# Patient Record
Sex: Female | Born: 1975 | Hispanic: No | Marital: Married | Smoking: Never smoker
Health system: Southern US, Community
[De-identification: ages and names within clinical notes are randomized; demographics above are authoritative.]

## PROBLEM LIST (undated history)

## (undated) ENCOUNTER — Inpatient Hospital Stay (HOSPITAL_COMMUNITY): Payer: Self-pay

## (undated) DIAGNOSIS — I10 Essential (primary) hypertension: Secondary | ICD-10-CM

## (undated) HISTORY — PX: MYOMECTOMY ABDOMINAL APPROACH: SUR870

---

## 2015-09-11 ENCOUNTER — Inpatient Hospital Stay (HOSPITAL_COMMUNITY)
Admission: AD | Admit: 2015-09-11 | Discharge: 2015-09-11 | Disposition: A | Discharge status: Home / Self Care | Payer: Self-pay | Source: Ambulatory Visit | Attending: Family Medicine | Admitting: Family Medicine

## 2015-09-11 ENCOUNTER — Inpatient Hospital Stay (HOSPITAL_COMMUNITY): Payer: Self-pay

## 2015-09-11 ENCOUNTER — Encounter (HOSPITAL_COMMUNITY): Payer: Self-pay | Admitting: *Deleted

## 2015-09-11 DIAGNOSIS — O09522 Supervision of elderly multigravida, second trimester: Secondary | ICD-10-CM

## 2015-09-11 DIAGNOSIS — O209 Hemorrhage in early pregnancy, unspecified: Secondary | ICD-10-CM | POA: Insufficient documentation

## 2015-09-11 DIAGNOSIS — O34219 Maternal care for unspecified type scar from previous cesarean delivery: Secondary | ICD-10-CM

## 2015-09-11 DIAGNOSIS — O3412 Maternal care for benign tumor of corpus uteri, second trimester: Secondary | ICD-10-CM | POA: Insufficient documentation

## 2015-09-11 DIAGNOSIS — D259 Leiomyoma of uterus, unspecified: Secondary | ICD-10-CM | POA: Diagnosis present

## 2015-09-11 DIAGNOSIS — O4691 Antepartum hemorrhage, unspecified, first trimester: Secondary | ICD-10-CM

## 2015-09-11 DIAGNOSIS — Z3A14 14 weeks gestation of pregnancy: Secondary | ICD-10-CM | POA: Insufficient documentation

## 2015-09-11 DIAGNOSIS — R58 Hemorrhage, not elsewhere classified: Secondary | ICD-10-CM

## 2015-09-11 DIAGNOSIS — O4692 Antepartum hemorrhage, unspecified, second trimester: Secondary | ICD-10-CM

## 2015-09-11 DIAGNOSIS — O10912 Unspecified pre-existing hypertension complicating pregnancy, second trimester: Secondary | ICD-10-CM | POA: Insufficient documentation

## 2015-09-11 DIAGNOSIS — Z98891 History of uterine scar from previous surgery: Secondary | ICD-10-CM

## 2015-09-11 DIAGNOSIS — O09529 Supervision of elderly multigravida, unspecified trimester: Secondary | ICD-10-CM

## 2015-09-11 HISTORY — DX: Essential (primary) hypertension: I10

## 2015-09-11 LAB — URINALYSIS, ROUTINE W REFLEX MICROSCOPIC
Bilirubin Urine: NEGATIVE
GLUCOSE, UA: NEGATIVE mg/dL
KETONES UR: NEGATIVE mg/dL
LEUKOCYTES UA: NEGATIVE
Nitrite: NEGATIVE
PH: 6.5 (ref 5.0–8.0)
Protein, ur: NEGATIVE mg/dL
Specific Gravity, Urine: 1.015 (ref 1.005–1.030)
Urobilinogen, UA: 0.2 mg/dL (ref 0.0–1.0)

## 2015-09-11 LAB — URINE MICROSCOPIC-ADD ON

## 2015-09-11 LAB — ABO/RH: ABO/RH(D): O POS

## 2015-09-11 MED ORDER — SODIUM CHLORIDE 0.9 % IV SOLN
INTRAVENOUS | Status: DC
  Administered 2015-09-11: 12:00:00 via INTRAVENOUS

## 2015-09-11 MED ORDER — ACETAMINOPHEN 325 MG PO TABS
650.0000 mg | ORAL_TABLET | Freq: Once | ORAL | Status: AC
  Administered 2015-09-11: 650 mg via ORAL
  Filled 2015-09-11: qty 2

## 2015-09-11 MED ORDER — LABETALOL HCL 200 MG PO TABS: 200.0000 mg | ORAL_TABLET | Freq: Two times a day (BID) | ORAL | Status: DC

## 2015-09-11 MED ORDER — LABETALOL HCL 5 MG/ML IV SOLN
20.0000 mg | Freq: Once | INTRAVENOUS | Status: AC
  Administered 2015-09-11: 20 mg via INTRAVENOUS
  Filled 2015-09-11: qty 4

## 2015-09-11 NOTE — MAU Note (Signed)
Pt C/O bleeding since 0200, denies pain.  Has changed one pad since bleeding started.

## 2015-09-11 NOTE — MAU Provider Note (Signed)
  History     CSN: [id number]  Arrival date and time: 09/11/15 1009   First Provider Initiated Contact with Patient 09/11/15 1101      Chief Complaint  Patient presents with  . Vaginal Bleeding   HPI  Lindsey Dean 39 y.o. G2P1001 [redacted]w[redacted]d presents to the MAU after having a light bleeding episode that started at 200am. She also has a history of chronic hypertension and is not currently on her amolodipine since she found out she was pregnant.  Past Medical History  Diagnosis Date  . Hypertension     Past Surgical History  Procedure Laterality Date  . Cesarean section    . Myomectomy abdominal approach      No family history on file.  Social History  Substance Use Topics  . Smoking status: Never Smoker   . Smokeless tobacco: None  . Alcohol Use: No    Allergies: No Known Allergies  No prescriptions prior to admission    Review of Systems  Constitutional: Negative for fever.  Genitourinary:       Vaginal bleeding   All other systems reviewed and are negative.  Physical Exam   Blood pressure 189/108, pulse 88, temperature 98.8 F (37.1 C), temperature source Oral, resp. rate 16.  Physical Exam  Nursing note and vitals reviewed. Constitutional: She is oriented to person, place, and time. She appears well-developed and well-nourished. No distress.  HENT:  Head: Normocephalic and atraumatic.  Neck: Normal range of motion.  Cardiovascular: Normal rate.   Blood pressure elevated  Respiratory: Effort normal. No respiratory distress.  Genitourinary:  Spec exam reveals old dark blood in vaginal vault; positive fht's with doppler  Musculoskeletal: Normal range of motion.  Neurological: She is alert and oriented to person, place, and time.  Skin: Skin is warm and dry.  Psychiatric: She has a normal mood and affect. Her behavior is normal. Judgment and thought content normal.    MAU Course  Procedures  MDM IV; antihypertensive; ultrasound Results for orders  placed or performed during the hospital encounter of 09/11/15 (from the past 24 hour(s))  Urinalysis, Routine w reflex microscopic (not at [facility])     Status: Abnormal   Collection Time: 09/11/15 10:25 AM  Result Value Ref Range   Color, Urine YELLOW YELLOW   APPearance CLEAR CLEAR   Specific Gravity, Urine 1.015 1.005 - 1.030   pH 6.5 5.0 - 8.0   Glucose, UA NEGATIVE NEGATIVE mg/dL   Hgb urine dipstick LARGE (A) NEGATIVE   Bilirubin Urine NEGATIVE NEGATIVE   Ketones, ur NEGATIVE NEGATIVE mg/dL   Protein, ur NEGATIVE NEGATIVE mg/dL   Urobilinogen, UA 0.2 0.0 - 1.0 mg/dL   Nitrite NEGATIVE NEGATIVE   Leukocytes, UA NEGATIVE NEGATIVE  Urine microscopic-add on     Status: Abnormal   Collection Time: 09/11/15 10:25 AM  Result Value Ref Range   Squamous Epithelial / LPF MANY (A) RARE   WBC, UA 0-2 <3 WBC/hpf   RBC / HPF 7-10 <3 RBC/hpf   Bacteria, UA FEW (A) RARE   Urine-Other MUCOUS PRESENT   ABO/Rh     Status: None   Collection Time: 09/11/15 12:20 PM  Result Value Ref Range   ABO/RH(D) O POS    Assessment and Plan  Vaginal bleeding in pregnancy Uterine fibroids Chronic Hypertension Discharge to home with labetalol  Lindsey Dean 09/11/2015, 11:13 AM

## 2015-09-11 NOTE — Discharge Instructions (Signed)
Uterine Fibroids Uterine fibroids are tissue masses (tumors) that can develop in the womb (uterus). They are also called leiomyomas. This type of tumor is not cancerous (benign) and does not spread to other parts of the body outside of the pelvic area, which is between the hip bones. Occasionally, fibroids may develop in the fallopian tubes, in the cervix, or on the support structures (ligaments) that surround the uterus. You can have one or many fibroids. Fibroids can vary in size, weight, and where they grow in the uterus. Some can become quite large. Most fibroids do not require medical treatment. CAUSES A fibroid can develop when a single uterine cell keeps growing (replicating). Most cells in the human body have a control mechanism that keeps them from replicating without control. SIGNS AND SYMPTOMS Symptoms may include:   Heavy bleeding during your period.  Bleeding or spotting between periods.  Pelvic pain and pressure.  Bladder problems, such as needing to urinate more often (urinary frequency) or urgently.  Inability to reproduce offspring (infertility).  Miscarriages. DIAGNOSIS Uterine fibroids are diagnosed through a physical exam. Your health care provider may feel the lumpy tumors during a pelvic exam. Ultrasonography and an MRI may be done to determine the size, location, and number of fibroids. TREATMENT Treatment may include:  Watchful waiting. This involves getting the fibroid checked by your health care provider to see if it grows or shrinks. Follow your health care provider's recommendations for how often to have this checked.  Hormone medicines. These can be taken by mouth or given through an intrauterine device (IUD).  Surgery.  Removing the fibroids (myomectomy) or the uterus (hysterectomy).  Removing blood supply to the fibroids (uterine artery embolization). If fibroids interfere with your fertility and you want to become pregnant, your health care provider  may recommend having the fibroids removed.  HOME CARE INSTRUCTIONS  Keep all follow-up visits as directed by your health care provider. This is important.  Take medicines only as directed by your health care provider.  If you were prescribed a hormone treatment, take the hormone medicines exactly as directed.  Do not take aspirin, because it can cause bleeding.  Ask your health care provider about taking iron pills and increasing the amount of dark green, leafy vegetables in your diet. These actions can help to boost your blood iron levels, which may be affected by heavy menstrual bleeding.  Pay close attention to your period and tell your health care provider about any changes, such as:  Increased blood flow that requires you to use more pads or tampons than usual per month.  A change in the number of days that your period lasts per month.  A change in symptoms that are associated with your period, such as abdominal cramping or back pain. SEEK MEDICAL CARE IF:  You have pelvic pain, back pain, or abdominal cramps that cannot be controlled with medicines.  You have an increase in bleeding between and during periods.  You soak tampons or pads in a half hour or less.  You feel lightheaded, extra tired, or weak. SEEK IMMEDIATE MEDICAL CARE IF:  You faint.  You have a sudden increase in pelvic pain.   This information is not intended to replace advice given to you by your health care provider. Make sure you discuss any questions you have with your health care provider.   Document Released: 10/25/2000 Document Revised: 11/18/2014 Document Reviewed: 04/26/2014 Elsevier Interactive Patient Education 2016 [employer].  Vaginal Bleeding During Pregnancy, Second Trimester  A small amount of bleeding (spotting) from the vagina is relatively common in pregnancy. It usually stops on its own. Various things can cause bleeding or spotting in pregnancy. Some bleeding may be related to the  pregnancy, and some may not. Sometimes the bleeding is normal and is not a problem. However, bleeding can also be a sign of something serious. Be sure to tell your health care provider about any vaginal bleeding right away. Some possible causes of vaginal bleeding during the second trimester include:  Infection, inflammation, or growths on the cervix.   The placenta may be partially or completely covering the opening of the cervix inside the uterus (placenta previa).  The placenta may have separated from the uterus (abruption of the placenta).   You may be having early (preterm) labor.   The cervix may not be strong enough to keep a baby inside the uterus (cervical insufficiency).   Tiny cysts may have developed in the uterus instead of pregnancy tissue (molar pregnancy). HOME CARE INSTRUCTIONS  Watch your condition for any changes. The following actions may help to lessen any discomfort you are feeling:  Follow your health care provider's instructions for limiting your activity. If your health care provider orders bed rest, you may need to stay in bed and only get up to use the bathroom. However, your health care provider may allow you to continue light activity.  If needed, make plans for someone to help with your regular activities and responsibilities while you are on bed rest.  Keep track of the number of pads you use each day, how often you change pads, and how soaked (saturated) they are. Write this down.  Do not use tampons. Do not douche.  Do not have sexual intercourse or orgasms until approved by your health care provider.  If you pass any tissue from your vagina, save the tissue so you can show it to your health care provider.  Only take over-the-counter or prescription medicines as directed by your health care provider.  Do not take aspirin because it can make you bleed.  Do not exercise or perform any strenuous activities or heavy lifting without your health care  provider's permission.  Keep all follow-up appointments as directed by your health care provider. SEEK MEDICAL CARE IF:  You have any vaginal bleeding during any part of your pregnancy.  You have cramps or labor pains.  You have a fever, not controlled by medicine. SEEK IMMEDIATE MEDICAL CARE IF:   You have severe cramps in your back or belly (abdomen).  You have contractions.  You have chills.  You pass large clots or tissue from your vagina.  Your bleeding increases.  You feel light-headed or weak, or you have fainting episodes.  You are leaking fluid or have a gush of fluid from your vagina. MAKE SURE YOU:  Understand these instructions.  Will watch your condition.  Will get help right away if you are not doing well or get worse.   This information is not intended to replace advice given to you by your health care provider. Make sure you discuss any questions you have with your health care provider.   Document Released: 08/07/2005 Document Revised: 11/02/2013 Document Reviewed: 07/05/2013 Elsevier Interactive Patient Education [employer].

## 2015-09-21 ENCOUNTER — Other Ambulatory Visit (HOSPITAL_COMMUNITY): Payer: Self-pay | Admitting: Urology

## 2015-09-21 DIAGNOSIS — Z3689 Encounter for other specified antenatal screening: Secondary | ICD-10-CM

## 2015-09-21 DIAGNOSIS — Z3A19 19 weeks gestation of pregnancy: Secondary | ICD-10-CM

## 2015-09-21 DIAGNOSIS — O09522 Supervision of elderly multigravida, second trimester: Secondary | ICD-10-CM

## 2015-09-21 LAB — OB RESULTS CONSOLE ANTIBODY SCREEN: Antibody Screen: NEGATIVE

## 2015-09-21 LAB — OB RESULTS CONSOLE HGB/HCT, BLOOD
HEMATOCRIT: 36 %
HEMOGLOBIN: 11.9 g/dL

## 2015-09-21 LAB — OB RESULTS CONSOLE GC/CHLAMYDIA
CHLAMYDIA, DNA PROBE: NEGATIVE
GC PROBE AMP, GENITAL: NEGATIVE

## 2015-09-21 LAB — OB RESULTS CONSOLE ABO/RH: RH Type: POSITIVE

## 2015-09-21 LAB — OB RESULTS CONSOLE VARICELLA ZOSTER ANTIBODY, IGG: Varicella: IMMUNE

## 2015-09-21 LAB — OB RESULTS CONSOLE HEPATITIS B SURFACE ANTIGEN: Hepatitis B Surface Ag: NEGATIVE

## 2015-09-21 LAB — OB RESULTS CONSOLE HIV ANTIBODY (ROUTINE TESTING): HIV: NONREACTIVE

## 2015-09-21 LAB — OB RESULTS CONSOLE RPR: RPR: NONREACTIVE

## 2015-09-21 LAB — OB RESULTS CONSOLE RUBELLA ANTIBODY, IGM: RUBELLA: IMMUNE

## 2015-09-21 LAB — OB RESULTS CONSOLE PLATELET COUNT: PLATELETS: 215 10*3/uL

## 2015-10-09 ENCOUNTER — Encounter: Payer: Self-pay | Admitting: *Deleted

## 2015-10-09 ENCOUNTER — Encounter: Payer: Self-pay | Admitting: Obstetrics and Gynecology

## 2015-10-09 ENCOUNTER — Ambulatory Visit (INDEPENDENT_AMBULATORY_CARE_PROVIDER_SITE_OTHER): Payer: Self-pay | Admitting: Obstetrics and Gynecology

## 2015-10-09 VITALS — BP 152/97 | HR 86 | Temp 98.3°F | Wt 193.5 lb

## 2015-10-09 DIAGNOSIS — O10012 Pre-existing essential hypertension complicating pregnancy, second trimester: Secondary | ICD-10-CM

## 2015-10-09 DIAGNOSIS — O0992 Supervision of high risk pregnancy, unspecified, second trimester: Secondary | ICD-10-CM

## 2015-10-09 DIAGNOSIS — Z8759 Personal history of other complications of pregnancy, childbirth and the puerperium: Secondary | ICD-10-CM

## 2015-10-09 DIAGNOSIS — O10019 Pre-existing essential hypertension complicating pregnancy, unspecified trimester: Secondary | ICD-10-CM | POA: Insufficient documentation

## 2015-10-09 DIAGNOSIS — D259 Leiomyoma of uterus, unspecified: Secondary | ICD-10-CM

## 2015-10-09 DIAGNOSIS — N90810 Female genital mutilation status, unspecified: Secondary | ICD-10-CM

## 2015-10-09 DIAGNOSIS — O0990 Supervision of high risk pregnancy, unspecified, unspecified trimester: Secondary | ICD-10-CM | POA: Insufficient documentation

## 2015-10-09 DIAGNOSIS — I10 Essential (primary) hypertension: Secondary | ICD-10-CM | POA: Insufficient documentation

## 2015-10-09 DIAGNOSIS — Z98891 History of uterine scar from previous surgery: Secondary | ICD-10-CM

## 2015-10-09 LAB — POCT URINALYSIS DIP (DEVICE)
BILIRUBIN URINE: NEGATIVE
Glucose, UA: NEGATIVE mg/dL
Hgb urine dipstick: NEGATIVE
KETONES UR: NEGATIVE mg/dL
LEUKOCYTES UA: NEGATIVE
NITRITE: NEGATIVE
Protein, ur: NEGATIVE mg/dL
Specific Gravity, Urine: 1.02 (ref 1.005–1.030)
Urobilinogen, UA: 0.2 mg/dL (ref 0.0–1.0)
pH: 7 (ref 5.0–8.0)

## 2015-10-09 MED ORDER — ASPIRIN EC 81 MG PO TBEC: 81.0000 mg | DELAYED_RELEASE_TABLET | Freq: Every day | ORAL | Status: DC

## 2015-10-09 NOTE — Patient Instructions (Signed)
Contraception Choices Contraception (birth control) is the use of any methods or devices to prevent pregnancy. Below are some methods to help avoid pregnancy. HORMONAL METHODS   Contraceptive implant. This is a thin, plastic tube containing progesterone hormone. It does not contain estrogen hormone. Your health care provider inserts the tube in the inner part of the upper arm. The tube can remain in place for up to 3 years. After 3 years, the implant must be removed. The implant prevents the ovaries from releasing an egg (ovulation), thickens the cervical mucus to prevent sperm from entering the uterus, and thins the lining of the inside of the uterus.  Progesterone-only injections. These injections are given every 3 months by your health care provider to prevent pregnancy. This synthetic progesterone hormone stops the ovaries from releasing eggs. It also thickens cervical mucus and changes the uterine lining. This makes it harder for sperm to survive in the uterus.  Birth control pills. These pills contain estrogen and progesterone hormone. They work by preventing the ovaries from releasing eggs (ovulation). They also cause the cervical mucus to thicken, preventing the sperm from entering the uterus. Birth control pills are prescribed by a health care provider.Birth control pills can also be used to treat heavy periods.  Minipill. This type of birth control pill contains only the progesterone hormone. They are taken every day of each month and must be prescribed by your health care provider.  Birth control patch. The patch contains hormones similar to those in birth control pills. It must be changed once a week and is prescribed by a health care provider.  Vaginal ring. The ring contains hormones similar to those in birth control pills. It is left in the vagina for 3 weeks, removed for 1 week, and then a new one is put back in place. The patient must be comfortable inserting and removing the ring  from the vagina.A health care provider's prescription is necessary.  Emergency contraception. Emergency contraceptives prevent pregnancy after unprotected sexual intercourse. This pill can be taken right after sex or up to 5 days after unprotected sex. It is most effective the sooner you take the pills after having sexual intercourse. Most emergency contraceptive pills are available without a prescription. Check with your pharmacist. Do not use emergency contraception as your only form of birth control. BARRIER METHODS   Female condom. This is a thin sheath (latex or rubber) that is worn over the penis during sexual intercourse. It can be used with spermicide to increase effectiveness.  Female condom. This is a soft, loose-fitting sheath that is put into the vagina before sexual intercourse.  Diaphragm. This is a soft, latex, dome-shaped barrier that must be fitted by a health care provider. It is inserted into the vagina, along with a spermicidal jelly. It is inserted before intercourse. The diaphragm should be left in the vagina for 6 to 8 hours after intercourse.  Cervical cap. This is a round, soft, latex or plastic cup that fits over the cervix and must be fitted by a health care provider. The cap can be left in place for up to 48 hours after intercourse.  Sponge. This is a soft, circular piece of polyurethane foam. The sponge has spermicide in it. It is inserted into the vagina after wetting it and before sexual intercourse.  Spermicides. These are chemicals that kill or block sperm from entering the cervix and uterus. They come in the form of creams, jellies, suppositories, foam, or tablets. They do not require a   prescription. They are inserted into the vagina with an applicator before having sexual intercourse. The process must be repeated every time you have sexual intercourse. INTRAUTERINE CONTRACEPTION  Intrauterine device (IUD). This is a T-shaped device that is put in a woman's uterus  during a menstrual period to prevent pregnancy. There are 2 types:  Copper IUD. This type of IUD is wrapped in copper wire and is placed inside the uterus. Copper makes the uterus and fallopian tubes produce a fluid that kills sperm. It can stay in place for 10 years.  Hormone IUD. This type of IUD contains the hormone progestin (synthetic progesterone). The hormone thickens the cervical mucus and prevents sperm from entering the uterus, and it also thins the uterine lining to prevent implantation of a fertilized egg. The hormone can weaken or kill the sperm that get into the uterus. It can stay in place for 3-5 years, depending on which type of IUD is used. PERMANENT METHODS OF CONTRACEPTION  Female tubal ligation. This is when the woman's fallopian tubes are surgically sealed, tied, or blocked to prevent the egg from traveling to the uterus.  Hysteroscopic sterilization. This involves placing a small coil or insert into each fallopian tube. Your doctor uses a technique called hysteroscopy to do the procedure. The device causes scar tissue to form. This results in permanent blockage of the fallopian tubes, so the sperm cannot fertilize the egg. It takes about 3 months after the procedure for the tubes to become blocked. You must use another form of birth control for these 3 months.  Female sterilization. This is when the female has the tubes that carry sperm tied off (vasectomy).This blocks sperm from entering the vagina during sexual intercourse. After the procedure, the man can still ejaculate fluid (semen). NATURAL PLANNING METHODS  Natural family planning. This is not having sexual intercourse or using a barrier method (condom, diaphragm, cervical cap) on days the woman could become pregnant.  Calendar method. This is keeping track of the length of each menstrual cycle and identifying when you are fertile.  Ovulation method. This is avoiding sexual intercourse during ovulation.  Symptothermal  method. This is avoiding sexual intercourse during ovulation, using a thermometer and ovulation symptoms.  Post-ovulation method. This is timing sexual intercourse after you have ovulated. Regardless of which type or method of contraception you choose, it is important that you use condoms to protect against the transmission of sexually transmitted infections (STIs). Talk with your health care provider about which form of contraception is most appropriate for you.   This information is not intended to replace advice given to you by your health care provider. Make sure you discuss any questions you have with your health care provider.   Document Released: 10/28/2005 Document Revised: 11/02/2013 Document Reviewed: 04/22/2013 Elsevier Interactive Patient Education 2016 [employer]  Breastfeeding Deciding to breastfeed is one of the best choices you can make for you and your baby. A change in hormones during pregnancy causes your breast tissue to grow and increases the number and size of your milk ducts. These hormones also allow proteins, sugars, and fats from your blood supply to make breast milk in your milk-producing glands. Hormones prevent breast milk from being released before your baby is born as well as prompt milk flow after birth. Once breastfeeding has begun, thoughts of your baby, as well as his or her sucking or crying, can stimulate the release of milk from your milk-producing glands.  BENEFITS OF BREASTFEEDING For Your Baby    Your first milk (colostrum) helps your baby's digestive system function better.  There are antibodies in your milk that help your baby fight off infections.  Your baby has a lower incidence of asthma, allergies, and sudden infant death syndrome.  The nutrients in breast milk are better for your baby than infant formulas and are designed uniquely for your baby's needs.  Breast milk improves your baby's brain development.  Your baby is less likely to develop  other conditions, such as childhood obesity, asthma, or type 2 diabetes mellitus. For You  Breastfeeding helps to create a very special bond between you and your baby.  Breastfeeding is convenient. Breast milk is always available at the correct temperature and costs nothing.  Breastfeeding helps to burn calories and helps you lose the weight gained during pregnancy.  Breastfeeding makes your uterus contract to its prepregnancy size faster and slows bleeding (lochia) after you give birth.   Breastfeeding helps to lower your risk of developing type 2 diabetes mellitus, osteoporosis, and breast or ovarian cancer later in life. SIGNS THAT YOUR BABY IS HUNGRY Early Signs of Hunger  Increased alertness or activity.  Stretching.  Movement of the head from side to side.  Movement of the head and opening of the mouth when the corner of the mouth or cheek is stroked (rooting).  Increased sucking sounds, smacking lips, cooing, sighing, or squeaking.  Hand-to-mouth movements.  Increased sucking of fingers or hands. Late Signs of Hunger  Fussing.  Intermittent crying. Extreme Signs of Hunger Signs of extreme hunger will require calming and consoling before your baby will be able to breastfeed successfully. Do not wait for the following signs of extreme hunger to occur before you initiate breastfeeding:  Restlessness.  A loud, strong cry.  Screaming. BREASTFEEDING BASICS Breastfeeding Initiation  Find a comfortable place to sit or lie down, with your neck and back well supported.  Place a pillow or rolled up blanket under your baby to bring him or her to the level of your breast (if you are seated). Nursing pillows are specially designed to help support your arms and your baby while you breastfeed.  Make sure that your baby's abdomen is facing your abdomen.  Gently massage your breast. With your fingertips, massage from your chest wall toward your nipple in a circular motion.  This encourages milk flow. You may need to continue this action during the feeding if your milk flows slowly.  Support your breast with 4 fingers underneath and your thumb above your nipple. Make sure your fingers are well away from your nipple and your baby's mouth.  Stroke your baby's lips gently with your finger or nipple.  When your baby's mouth is open wide enough, quickly bring your baby to your breast, placing your entire nipple and as much of the colored area around your nipple (areola) as possible into your baby's mouth.  More areola should be visible above your baby's upper lip than below the lower lip.  Your baby's tongue should be between his or her lower gum and your breast.  Ensure that your baby's mouth is correctly positioned around your nipple (latched). Your baby's lips should create a seal on your breast and be turned out (everted).  It is common for your baby to suck about 2-3 minutes in order to start the flow of breast milk. Latching Teaching your baby how to latch on to your breast properly is very important. An improper latch can cause nipple pain and decreased milk supply for   you and poor weight gain in your baby. Also, if your baby is not latched onto your nipple properly, he or she may swallow some air during feeding. This can make your baby fussy. Burping your baby when you switch breasts during the feeding can help to get rid of the air. However, teaching your baby to latch on properly is still the best way to prevent fussiness from swallowing air while breastfeeding. Signs that your baby has successfully latched on to your nipple:  Silent tugging or silent sucking, without causing you pain.  Swallowing heard between every 3-4 sucks.  Muscle movement above and in front of his or her ears while sucking. Signs that your baby has not successfully latched on to nipple:  Sucking sounds or smacking sounds from your baby while breastfeeding.  Nipple pain. If you  think your baby has not latched on correctly, slip your finger into the corner of your baby's mouth to break the suction and place it between your baby's gums. Attempt breastfeeding initiation again. Signs of Successful Breastfeeding Signs from your baby:  A gradual decrease in the number of sucks or complete cessation of sucking.  Falling asleep.  Relaxation of his or her body.  Retention of a small amount of milk in his or her mouth.  Letting go of your breast by himself or herself. Signs from you:  Breasts that have increased in firmness, weight, and size 1-3 hours after feeding.  Breasts that are softer immediately after breastfeeding.  Increased milk volume, as well as a change in milk consistency and color by the fifth day of breastfeeding.  Nipples that are not sore, cracked, or bleeding. Signs That Your Baby is Getting Enough Milk  Wetting at least 3 diapers in a 24-hour period. The urine should be clear and pale yellow by age 5 days.  At least 3 stools in a 24-hour period by age 5 days. The stool should be soft and yellow.  At least 3 stools in a 24-hour period by age 7 days. The stool should be seedy and yellow.  No loss of weight greater than 10% of birth weight during the first 3 days of age.  Average weight gain of 4-7 ounces (113-198 g) per week after age 4 days.  Consistent daily weight gain by age 5 days, without weight loss after the age of 2 weeks. After a feeding, your baby may spit up a small amount. This is common. BREASTFEEDING FREQUENCY AND DURATION Frequent feeding will help you make more milk and can prevent sore nipples and breast engorgement. Breastfeed when you feel the need to reduce the fullness of your breasts or when your baby shows signs of hunger. This is called "breastfeeding on demand." Avoid introducing a pacifier to your baby while you are working to establish breastfeeding (the first 4-6 weeks after your baby is born). After this time you  may choose to use a pacifier. Research has shown that pacifier use during the first year of a baby's life decreases the risk of sudden infant death syndrome (SIDS). Allow your baby to feed on each breast as long as he or she wants. Breastfeed until your baby is finished feeding. When your baby unlatches or falls asleep while feeding from the first breast, offer the second breast. Because newborns are often sleepy in the first few weeks of life, you may need to awaken your baby to get him or her to feed. Breastfeeding times will vary from baby to baby. However, the following   rules can serve as a guide to help you ensure that your baby is properly fed:  Newborns (babies 4 weeks of age or younger) may breastfeed every 1-3 hours.  Newborns should not go longer than 3 hours during the day or 5 hours during the night without breastfeeding.  You should breastfeed your baby a minimum of 8 times in a 24-hour period until you begin to introduce solid foods to your baby at around 6 months of age. BREAST MILK PUMPING Pumping and storing breast milk allows you to ensure that your baby is exclusively fed your breast milk, even at times when you are unable to breastfeed. This is especially important if you are going back to work while you are still breastfeeding or when you are not able to be present during feedings. Your lactation consultant can give you guidelines on how long it is safe to store breast milk. A breast pump is a machine that allows you to pump milk from your breast into a sterile bottle. The pumped breast milk can then be stored in a refrigerator or freezer. Some breast pumps are operated by hand, while others use electricity. Ask your lactation consultant which type will work best for you. Breast pumps can be purchased, but some hospitals and breastfeeding support groups lease breast pumps on a monthly basis. A lactation consultant can teach you how to hand express breast milk, if you prefer not to use  a pump. CARING FOR YOUR BREASTS WHILE YOU BREASTFEED Nipples can become dry, cracked, and sore while breastfeeding. The following recommendations can help keep your breasts moisturized and healthy:  Avoid using soap on your nipples.  Wear a supportive bra. Although not required, special nursing bras and tank tops are designed to allow access to your breasts for breastfeeding without taking off your entire bra or top. Avoid wearing underwire-style bras or extremely tight bras.  Air dry your nipples for 3-4minutes after each feeding.  Use only cotton bra pads to absorb leaked breast milk. Leaking of breast milk between feedings is normal.  Use lanolin on your nipples after breastfeeding. Lanolin helps to maintain your skin's normal moisture barrier. If you use pure lanolin, you do not need to wash it off before feeding your baby again. Pure lanolin is not toxic to your baby. You may also hand express a few drops of breast milk and gently massage that milk into your nipples and allow the milk to air dry. In the first few weeks after giving birth, some women experience extremely full breasts (engorgement). Engorgement can make your breasts feel heavy, warm, and tender to the touch. Engorgement peaks within 3-5 days after you give birth. The following recommendations can help ease engorgement:  Completely empty your breasts while breastfeeding or pumping. You may want to start by applying warm, moist heat (in the shower or with warm water-soaked hand towels) just before feeding or pumping. This increases circulation and helps the milk flow. If your baby does not completely empty your breasts while breastfeeding, pump any extra milk after he or she is finished.  Wear a snug bra (nursing or regular) or tank top for 1-2 days to signal your body to slightly decrease milk production.  Apply ice packs to your breasts, unless this is too uncomfortable for you.  Make sure that your baby is latched on and  positioned properly while breastfeeding. If engorgement persists after 48 hours of following these recommendations, contact your health care provider or a lactation consultant. OVERALL HEALTH   CARE RECOMMENDATIONS WHILE BREASTFEEDING  Eat healthy foods. Alternate between meals and snacks, eating 3 of each per day. Because what you eat affects your breast milk, some of the foods may make your baby more irritable than usual. Avoid eating these foods if you are sure that they are negatively affecting your baby.  Drink milk, fruit juice, and water to satisfy your thirst (about 10 glasses a day).  Rest often, relax, and continue to take your prenatal vitamins to prevent fatigue, stress, and anemia.  Continue breast self-awareness checks.  Avoid chewing and smoking tobacco. Chemicals from cigarettes that pass into breast milk and exposure to secondhand smoke may harm your baby.  Avoid alcohol and drug use, including marijuana. Some medicines that may be harmful to your baby can pass through breast milk. It is important to ask your health care provider before taking any medicine, including all over-the-counter and prescription medicine as well as vitamin and herbal supplements. It is possible to become pregnant while breastfeeding. If birth control is desired, ask your health care provider about options that will be safe for your baby. SEEK MEDICAL CARE IF:  You feel like you want to stop breastfeeding or have become frustrated with breastfeeding.  You have painful breasts or nipples.  Your nipples are cracked or bleeding.  Your breasts are red, tender, or warm.  You have a swollen area on either breast.  You have a fever or chills.  You have nausea or vomiting.  You have drainage other than breast milk from your nipples.  Your breasts do not become full before feedings by the fifth day after you give birth.  You feel sad and depressed.  Your baby is too sleepy to eat well.  Your  baby is having trouble sleeping.   Your baby is wetting less than 3 diapers in a 24-hour period.  Your baby has less than 3 stools in a 24-hour period.  Your baby's skin or the white part of his or her eyes becomes yellow.   Your baby is not gaining weight by 5 days of age. SEEK IMMEDIATE MEDICAL CARE IF:  Your baby is overly tired (lethargic) and does not want to wake up and feed.  Your baby develops an unexplained fever.   This information is not intended to replace advice given to you by your health care provider. Make sure you discuss any questions you have with your health care provider.   Document Released: 10/28/2005 Document Revised: 07/19/2015 Document Reviewed: 04/21/2013 Elsevier Interactive Patient Education 2016 [employer]  

## 2015-10-09 NOTE — Progress Notes (Signed)
Pt reports not taking Labetalol she usually takes it @ 1000 Pain- left hip  Pt states that husband complains of snoring  New ob packet given

## 2015-10-09 NOTE — Progress Notes (Signed)
   Subjective:    Lindsey Dean is a G2P1001 [redacted]w[redacted]d being seen today for her first obstetrical visit.  Care transferred from health department secondary to [facility]. Her obstetrical history is significant for pregnancy induced hypertension and CHTN, previous cesarean section secondary to prior myomectomy. Patient does intend to breast feed. Pregnancy history fully reviewed.  Patient reports no complaints.  Filed Vitals:   10/09/15 0818  BP: 152/97  Pulse: 86  Temp: 98.3 F (36.8 C)  Weight: 193 lb 8 oz (87.771 kg)    HISTORY: OB History  Gravida Para Term Preterm AB SAB TAB Ectopic Multiple Living  2 1 1       1     # Outcome Date GA Lbr Len/2nd Weight Sex Delivery Anes PTL Lv  2 Current           1 Term 10/09/09 [redacted]w[redacted]d  5 lb 4.7 oz (2.4 kg) M CS-LVertical Spinal  Y     Past Medical History  Diagnosis Date  . Hypertension    Past Surgical History  Procedure Laterality Date  . Cesarean section    . Myomectomy abdominal approach     History reviewed. No pertinent family history.   Exam       Assessment:    Pregnancy: G2P1001 Patient Active Problem List   Diagnosis Date Noted  . Essential hypertension antepartum 10/09/2015  . Essential hypertension 10/09/2015  . History of pregnancy induced hypertension 10/09/2015  . Advanced maternal age in multigravida 09/11/2015  . Fibroid, uterine 09/11/2015  . H/O cesarean section 09/11/2015        Plan:     Initial labs drawn. Prenatal vitamins. Problem list reviewed and updated. Genetic Screening discussed : patient scheduled to meet with genetic counselor on 12/6.  Ultrasound discussed; fetal survey: scheduled for 12/6. Advised patient to start ASA 81 mg daily Baseline labs ordered Records from health department not available at time of visit  Follow up in 3 weeks. 50% of 30 min visit spent on counseling and coordination of care.     Patrcia Schnepp 10/09/2015

## 2015-10-17 ENCOUNTER — Ambulatory Visit (HOSPITAL_COMMUNITY)
Admission: RE | Admit: 2015-10-17 | Discharge: 2015-10-17 | Disposition: A | Discharge status: Home / Self Care | Payer: Medicaid Other | Source: Ambulatory Visit | Attending: Physician Assistant | Admitting: Physician Assistant

## 2015-10-17 ENCOUNTER — Encounter (HOSPITAL_COMMUNITY): Payer: Self-pay

## 2015-10-17 VITALS — BP 151/86 | HR 74 | Wt 194.8 lb

## 2015-10-17 DIAGNOSIS — Z3689 Encounter for other specified antenatal screening: Secondary | ICD-10-CM

## 2015-10-17 DIAGNOSIS — O09529 Supervision of elderly multigravida, unspecified trimester: Secondary | ICD-10-CM

## 2015-10-17 DIAGNOSIS — O34219 Maternal care for unspecified type scar from previous cesarean delivery: Secondary | ICD-10-CM | POA: Diagnosis not present

## 2015-10-17 DIAGNOSIS — Z3A19 19 weeks gestation of pregnancy: Secondary | ICD-10-CM | POA: Diagnosis not present

## 2015-10-17 DIAGNOSIS — O09522 Supervision of elderly multigravida, second trimester: Secondary | ICD-10-CM | POA: Insufficient documentation

## 2015-10-17 DIAGNOSIS — Z36 Encounter for antenatal screening of mother: Secondary | ICD-10-CM | POA: Diagnosis not present

## 2015-10-17 DIAGNOSIS — Z315 Encounter for genetic counseling: Secondary | ICD-10-CM | POA: Insufficient documentation

## 2015-10-17 DIAGNOSIS — O3412 Maternal care for benign tumor of corpus uteri, second trimester: Secondary | ICD-10-CM | POA: Insufficient documentation

## 2015-10-17 DIAGNOSIS — O10012 Pre-existing essential hypertension complicating pregnancy, second trimester: Secondary | ICD-10-CM | POA: Diagnosis not present

## 2015-10-17 LAB — COMPREHENSIVE METABOLIC PANEL
ALBUMIN: 3.3 g/dL — AB (ref 3.6–5.1)
ALT: 14 U/L (ref 6–29)
AST: 14 U/L (ref 10–30)
Alkaline Phosphatase: 60 U/L (ref 33–115)
BILIRUBIN TOTAL: 0.3 mg/dL (ref 0.2–1.2)
BUN: 7 mg/dL (ref 7–25)
CO2: 23 mmol/L (ref 20–31)
CREATININE: 0.6 mg/dL (ref 0.50–1.10)
Calcium: 8.8 mg/dL (ref 8.6–10.2)
Chloride: 102 mmol/L (ref 98–110)
GLUCOSE: 93 mg/dL (ref 65–99)
Potassium: 4.3 mmol/L (ref 3.5–5.3)
SODIUM: 134 mmol/L — AB (ref 135–146)
Total Protein: 6.2 g/dL (ref 6.1–8.1)

## 2015-10-17 NOTE — Progress Notes (Signed)
Genetic Counseling  High-Risk Gestation Note  Appointment Date:  10/17/2015 Referred By: Anders Grant, NP Date of Birth:  Dec 20, 1975   Pregnancy History: G2P1001 Estimated Date of Delivery: 03/09/16 Estimated Gestational Age: [redacted]w[redacted]d Attending: Renella Cunas, MD   [name] was seen for genetic counseling because of a maternal age of 39 y.o..    In Summary:  Approximate 1 in 3 risk for fetal aneuploidy related to maternal age of 39 years old at [redacted]w[redacted]d gestation  Quad screen previously performed; reduced Down syndrome risk (1 in 2320), Trisomy 18 (< 1 in 10,000), and ONTDs  Elevated hCG (3.41 MoM) associated with increased risk for adverse pregnancy outcomes  Detailed ultrasound performed today  Patient declined NIPS and amniocentesis  Follow-up ultrasound scheduled for 11/28/15   She was counseled regarding maternal age and the association with risk for chromosome conditions due to nondisjunction with aging of the ova.  We reviewed chromosomes, nondisjunction, and the associated 1 in 33 risk for fetal aneuploidy related to a maternal age of 38 y.o. at [redacted]w[redacted]d gestation.  She was counseled that the risk for aneuploidy decreases as gestational age increases, accounting for those pregnancies which spontaneously abort.  We specifically discussed Down syndrome (trisomy 23), trisomies 17 and 105, and sex chromosome aneuploidies (47,XXX and 47,XXY) including the common features and prognoses of each.   [name] previously had Quad screen performed through [facility], which was within normal range for the conditions screened. We reviewed the Quad screen result and the associated reduction in risks for fetal Down syndrome (1 in 2320), Trisomy 18 (< 1 in 10,000), and ONTDs. We discussed that screens are used to modify a patient's a priori risk for aneuploidy, typically based on age. This provides a pregnancy specific risk assessment. It is not  diagnostic and does not assess for all chromosome conditions.   We specifically discussed that the level of one of the proteins analyzed on the Quad screen, hCG, was very high (3.41 MoM).  This has been associated with an increased risk for growth restriction or poor pregnancy outcome later in pregnancy; therefore, we would recommend a follow up ultrasound for fetal growth in the third trimester.   We reviewed additional available screening options including noninvasive prenatal screening (NIPS)/cell free DNA (cfDNA) testing and detailed ultrasound.  She was counseled that screening tests are used to modify a patient's a priori risk for aneuploidy, typically based on age. This estimate provides a pregnancy specific risk assessment. We reviewed the benefits and limitations of each option. Specifically, we discussed the conditions for which each test screens, the detection rates, and false positive rates of each. She was also counseled regarding diagnostic testing via amniocentesis. We reviewed the approximate 1 in 99991111 risk for complications for amniocentesis, including spontaneous pregnancy loss.  Detailed ultrasound was performed today. Visualized fetal anatomy was within normal limits. Complete ultrasound results reported under separate cover. After consideration of all the options, she declined NIPS and amniocentesis. Follow-up ultrasound is scheduled for 11/28/15. She understands that screening tests cannot rule out all birth defects or genetic syndromes. The patient was advised of this limitation and states she still does not want additional testing at this time.   [name] was provided with written information regarding sickle cell anemia (SCA) including the carrier frequency and incidence in the African population, the availability of carrier testing and prenatal diagnosis if indicated.  In addition, we discussed that hemoglobinopathies are routinely screened for as part of  the [place] newborn  screening panel.  Hemoglobin electrophoresis was previously performed through her OB provider and was within normal range.  Both family histories were reviewed and found to be noncontributory for birth defects, intellectual disability, and known genetic conditions. Without further information regarding the provided family history, an accurate genetic risk cannot be calculated. Further genetic counseling is warranted if more information is obtained.   [name] denied exposure to environmental toxins or chemical agents. She denied the use of alcohol, tobacco or street drugs. She denied significant viral illnesses during the course of her pregnancy. Her medical and surgical histories were contributory for hypertension, which is currently treated with labetalol.   I counseled [name] regarding the above risks and available options.  The approximate face-to-face time with the genetic counselor was 40 minutes.  Chipper Oman, MS,  Certified Genetic Counselor 10/17/2015

## 2015-10-18 LAB — PROTEIN, URINE, 24 HOUR
PROTEIN 24H UR: 108 mg/(24.h) (ref <150)
Protein, Urine: 4 mg/dL — ABNORMAL LOW (ref 5–24)

## 2015-10-19 ENCOUNTER — Other Ambulatory Visit (HOSPITAL_COMMUNITY): Payer: Self-pay

## 2015-10-30 ENCOUNTER — Ambulatory Visit (INDEPENDENT_AMBULATORY_CARE_PROVIDER_SITE_OTHER): Payer: Medicaid Other | Admitting: Family Medicine

## 2015-10-30 VITALS — BP 137/90 | HR 88 | Temp 98.2°F | Wt 194.6 lb

## 2015-10-30 DIAGNOSIS — O10012 Pre-existing essential hypertension complicating pregnancy, second trimester: Secondary | ICD-10-CM | POA: Diagnosis not present

## 2015-10-30 DIAGNOSIS — Z8759 Personal history of other complications of pregnancy, childbirth and the puerperium: Secondary | ICD-10-CM | POA: Diagnosis not present

## 2015-10-30 DIAGNOSIS — O0992 Supervision of high risk pregnancy, unspecified, second trimester: Secondary | ICD-10-CM

## 2015-10-30 DIAGNOSIS — O09522 Supervision of elderly multigravida, second trimester: Secondary | ICD-10-CM

## 2015-10-30 LAB — POCT URINALYSIS DIP (DEVICE)
Bilirubin Urine: NEGATIVE
GLUCOSE, UA: NEGATIVE mg/dL
HGB URINE DIPSTICK: NEGATIVE
KETONES UR: NEGATIVE mg/dL
Leukocytes, UA: NEGATIVE
Nitrite: NEGATIVE
PH: 7 (ref 5.0–8.0)
PROTEIN: NEGATIVE mg/dL
SPECIFIC GRAVITY, URINE: 1.02 (ref 1.005–1.030)
UROBILINOGEN UA: 0.2 mg/dL (ref 0.0–1.0)

## 2015-10-30 NOTE — Patient Instructions (Signed)
If you notice any of these symptom, please call the clinic or go to the Maternity Admission Unit SIGNS AND SYMPTOMS  The earliest signs of preeclampsia are:  High blood pressure.  Increased protein in your urine. Your health care provider will check for this at every prenatal visit. Other symptoms that can develop include:   Severe headaches.  Sudden weight gain.  Swelling of your hands, face, legs, and feet.  Feeling sick to your stomach (nauseous) and throwing up (vomiting).  Vision problems (blurred or double vision).  Numbness in your face, arms, legs, and feet.  Dizziness.  Slurred speech.  Sensitivity to bright lights.  Abdominal pain.  Second Trimester of Pregnancy The second trimester is from week 13 through week 28, months 4 through 6. The second trimester is often a time when you feel your best. Your body has also adjusted to being pregnant, and you begin to feel better physically. Usually, morning sickness has lessened or quit completely, you may have more energy, and you may have an increase in appetite. The second trimester is also a time when the fetus is growing rapidly. At the end of the sixth month, the fetus is about 9 inches long and weighs about 1 pounds. You will likely begin to feel the baby move (quickening) between 18 and 20 weeks of the pregnancy. BODY CHANGES Your body goes through many changes during pregnancy. The changes vary from woman to woman.   Your weight will continue to increase. You will notice your lower abdomen bulging out.  You may begin to get stretch marks on your hips, abdomen, and breasts.  You may develop headaches that can be relieved by medicines approved by your health care provider.  You may urinate more often because the fetus is pressing on your bladder.  You may develop or continue to have heartburn as a result of your pregnancy.  You may develop constipation because certain hormones are causing the muscles that push  waste through your intestines to slow down.  You may develop hemorrhoids or swollen, bulging veins (varicose veins).  You may have back pain because of the weight gain and pregnancy hormones relaxing your joints between the bones in your pelvis and as a result of a shift in weight and the muscles that support your balance.  Your breasts will continue to grow and be tender.  Your gums may bleed and may be sensitive to brushing and flossing.  Dark spots or blotches (chloasma, mask of pregnancy) may develop on your face. This will likely fade after the baby is born.  A dark line from your belly button to the pubic area (linea nigra) may appear. This will likely fade after the baby is born.  You may have changes in your hair. These can include thickening of your hair, rapid growth, and changes in texture. Some women also have hair loss during or after pregnancy, or hair that feels dry or thin. Your hair will most likely return to normal after your baby is born. WHAT TO EXPECT AT YOUR PRENATAL VISITS During a routine prenatal visit:  You will be weighed to make sure you and the fetus are growing normally.  Your blood pressure will be taken.  Your abdomen will be measured to track your baby's growth.  The fetal heartbeat will be listened to.  Any test results from the previous visit will be discussed. Your health care provider may ask you:  How you are feeling.  If you are feeling the baby  move.  If you have had any abnormal symptoms, such as leaking fluid, bleeding, severe headaches, or abdominal cramping.  If you are using any tobacco products, including cigarettes, chewing tobacco, and electronic cigarettes.  If you have any questions. Other tests that may be performed during your second trimester include:  Blood tests that check for:  Low iron levels (anemia).  Gestational diabetes (between 24 and 28 weeks).  Rh antibodies.  Urine tests to check for infections, diabetes,  or protein in the urine.  An ultrasound to confirm the proper growth and development of the baby.  An amniocentesis to check for possible genetic problems.  Fetal screens for spina bifida and Down syndrome.  HIV (human immunodeficiency virus) testing. Routine prenatal testing includes screening for HIV, unless you choose not to have this test. HOME CARE INSTRUCTIONS   Avoid all smoking, herbs, alcohol, and unprescribed drugs. These chemicals affect the formation and growth of the baby.  Do not use any tobacco products, including cigarettes, chewing tobacco, and electronic cigarettes. If you need help quitting, ask your health care provider. You may receive counseling support and other resources to help you quit.  Follow your health care provider's instructions regarding medicine use. There are medicines that are either safe or unsafe to take during pregnancy.  Exercise only as directed by your health care provider. Experiencing uterine cramps is a good sign to stop exercising.  Continue to eat regular, healthy meals.  Wear a good support bra for breast tenderness.  Do not use hot tubs, steam rooms, or saunas.  Wear your seat belt at all times when driving.  Avoid raw meat, uncooked cheese, cat litter boxes, and soil used by cats. These carry germs that can cause birth defects in the baby.  Take your prenatal vitamins.  Take 1500-2000 mg of calcium daily starting at the 20th week of pregnancy until you deliver your baby.  Try taking a stool softener (if your health care provider approves) if you develop constipation. Eat more high-fiber foods, such as fresh vegetables or fruit and whole grains. Drink plenty of fluids to keep your urine clear or pale yellow.  Take warm sitz baths to soothe any pain or discomfort caused by hemorrhoids. Use hemorrhoid cream if your health care provider approves.  If you develop varicose veins, wear support hose. Elevate your feet for 15 minutes, 3-4  times a day. Limit salt in your diet.  Avoid heavy lifting, wear low heel shoes, and practice good posture.  Rest with your legs elevated if you have leg cramps or low back pain.  Visit your dentist if you have not gone yet during your pregnancy. Use a soft toothbrush to brush your teeth and be gentle when you floss.  A sexual relationship may be continued unless your health care provider directs you otherwise.  Continue to go to all your prenatal visits as directed by your health care provider. SEEK MEDICAL CARE IF:   You have dizziness.  You have mild pelvic cramps, pelvic pressure, or nagging pain in the abdominal area.  You have persistent nausea, vomiting, or diarrhea.  You have a bad smelling vaginal discharge.  You have pain with urination. SEEK IMMEDIATE MEDICAL CARE IF:   You have a fever.  You are leaking fluid from your vagina.  You have spotting or bleeding from your vagina.  You have severe abdominal cramping or pain.  You have rapid weight gain or loss.  You have shortness of breath with chest pain.  You notice sudden or extreme swelling of your face, hands, ankles, feet, or legs.  You have not felt your baby move in over an hour.  You have severe headaches that do not go away with medicine.  You have vision changes.   This information is not intended to replace advice given to you by your health care provider. Make sure you discuss any questions you have with your health care provider.   Document Released: 10/22/2001 Document Revised: 11/18/2014 Document Reviewed: 12/29/2012 Elsevier Interactive Patient Education 2016 [place] Medications in Pregnancy   Acne: Benzoyl Peroxide Salicylic Acid  Backache/Headache: Tylenol: 2 regular strength every 4 hours OR              2 Extra strength every 6 hours  Colds/Coughs/Allergies: Benadryl (alcohol free) 25 mg every 6 hours as needed Breath right strips Claritin Cepacol throat  lozenges Chloraseptic throat spray Cold-Eeze- up to three times per day Cough drops, alcohol free Flonase (by prescription only) Guaifenesin Mucinex Robitussin DM (plain only, alcohol free) Saline nasal spray/drops Sudafed (pseudoephedrine) & Actifed ** use only after [redacted] weeks gestation and if you do not have high blood pressure Tylenol Vicks Vaporub Zinc lozenges Zyrtec   Constipation: Colace Ducolax suppositories Fleet enema Glycerin suppositories Metamucil Milk of magnesia Miralax Senokot Smooth move tea  Diarrhea: Kaopectate Imodium A-D  *NO pepto Bismol  Hemorrhoids: Anusol Anusol HC Preparation H Tucks  Indigestion: Tums Maalox Mylanta Zantac  Pepcid  Insomnia: Benadryl (alcohol free) 25mg  every 6 hours as needed Tylenol PM Unisom, no Gelcaps  Leg Cramps: Tums MagGel  Nausea/Vomiting:  Bonine Dramamine Emetrol Ginger extract Sea bands Meclizine  Nausea medication to take during pregnancy:  Unisom (doxylamine succinate 25 mg tablets) Take one tablet daily at bedtime. If symptoms are not adequately controlled, the dose can be increased to a maximum recommended dose of two tablets daily (1/2 tablet in the morning, 1/2 tablet mid-afternoon and one at bedtime). Vitamin B6 100mg  tablets. Take one tablet twice a day (up to 200 mg per day).  Skin Rashes: Aveeno products Benadryl cream or 25mg  every 6 hours as needed Calamine Lotion 1% cortisone cream  Yeast infection: Gyne-lotrimin 7 Monistat 7   **If taking multiple medications, please check labels to avoid duplicating the same active ingredients **take medication as directed on the label ** Do not exceed 4000 mg of tylenol in 24 hours **Do not take medications that contain aspirin or ibuprofen

## 2015-10-30 NOTE — Progress Notes (Signed)
C/o throbbing headaches sometimes, not daily. States takes tylenol with relief.

## 2015-10-30 NOTE — Progress Notes (Signed)
Subjective:  Lindsey Dean is a 39 y.o. G2P1001 at [redacted]w[redacted]d being seen today for ongoing prenatal care.  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  Patient reports headache- 1 day a week, better with Tylenol.  Contractions: Not present. Vag. Bleeding: None.  Movement: Present. Denies leaking of fluid.   The following portions of the patient's history were reviewed and updated as appropriate: allergies, current medications, past family history, past medical history, past social history, past surgical history and problem list. Problem list updated.  Objective:   Filed Vitals:   10/30/15 0845 10/30/15 0848  BP: 149/88 137/90  Pulse: 88   Temp: 98.2 F (36.8 C)   Weight: 194 lb 9.6 oz (88.27 kg)     Fetal Status: Fetal Heart Rate (bpm): 142 Fundal Height: 23 cm Movement: Present     General:  Alert, oriented and cooperative. Patient is in no acute distress.  Skin: Skin is warm and dry. No rash noted.   Cardiovascular: Normal heart rate noted  Respiratory: Normal respiratory effort, no problems with respiration noted  Abdomen: Soft, gravid, appropriate for gestational age. Pain/Pressure: Present     Pelvic: Vag. Bleeding: None     Cervical exam deferred        Extremities: Normal range of motion.  Edema: None  Mental Status: Normal mood and affect. Normal behavior. Normal judgment and thought content.   Urinalysis: Urine Protein: Negative Urine Glucose: Negative  Assessment and Plan:  Pregnancy: G2P1001 at [redacted]w[redacted]d  1. Essential hypertension antepartum, second trimester -mild range today -continue labetalol at current dose -provided s/sx of preX  2. Advanced maternal age  in multigravida, second trimester  3. History of pregnancy induced hypertension  4. Supervision of high risk pregnancy, antepartum, second trimester Updated box  Preterm labor symptoms and general obstetric precautions including but not limited to vaginal bleeding, contractions, leaking of fluid and fetal movement were reviewed in detail with the patient. Please refer to After Visit Summary for other counseling recommendations.  Return in about 4 weeks (around 11/27/2015) for Routine prenatal care.   Caren Macadam, MD

## 2015-11-27 ENCOUNTER — Ambulatory Visit (INDEPENDENT_AMBULATORY_CARE_PROVIDER_SITE_OTHER): Payer: Medicaid Other | Admitting: Obstetrics & Gynecology

## 2015-11-27 ENCOUNTER — Telehealth: Payer: Self-pay

## 2015-11-27 ENCOUNTER — Encounter: Payer: Self-pay | Admitting: Family Medicine

## 2015-11-27 VITALS — BP 125/74 | HR 80 | Temp 98.0°F | Wt 195.9 lb

## 2015-11-27 DIAGNOSIS — D259 Leiomyoma of uterus, unspecified: Secondary | ICD-10-CM

## 2015-11-27 DIAGNOSIS — J069 Acute upper respiratory infection, unspecified: Secondary | ICD-10-CM

## 2015-11-27 DIAGNOSIS — O3412 Maternal care for benign tumor of corpus uteri, second trimester: Secondary | ICD-10-CM | POA: Diagnosis not present

## 2015-11-27 DIAGNOSIS — O10012 Pre-existing essential hypertension complicating pregnancy, second trimester: Secondary | ICD-10-CM

## 2015-11-27 LAB — POCT URINALYSIS DIP (DEVICE)
BILIRUBIN URINE: NEGATIVE
Glucose, UA: NEGATIVE mg/dL
Hgb urine dipstick: NEGATIVE
Ketones, ur: NEGATIVE mg/dL
LEUKOCYTES UA: NEGATIVE
NITRITE: NEGATIVE
Protein, ur: NEGATIVE mg/dL
Specific Gravity, Urine: 1.01 (ref 1.005–1.030)
Urobilinogen, UA: 0.2 mg/dL (ref 0.0–1.0)
pH: 6.5 (ref 5.0–8.0)

## 2015-11-27 MED ORDER — LABETALOL HCL 200 MG PO TABS: 200.0000 mg | ORAL_TABLET | Freq: Two times a day (BID) | ORAL | Status: DC

## 2015-11-27 MED ORDER — MUCINEX DM 30-600 MG PO TB12: 1.0000 | ORAL_TABLET | Freq: Two times a day (BID) | ORAL | Status: DC

## 2015-11-27 NOTE — Progress Notes (Signed)
Subjective:  Lindsey Dean is a 40 y.o. G2P1001 at [redacted]w[redacted]d being seen today for ongoing prenatal care.  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  Patient reports cough for 5 days, no fever, no sore throat, no ear pain..  Contractions: Not present. Vag. Bleeding: None.  Movement: Present. Denies leaking of fluid.   The following portions of the patient's history were reviewed and updated as appropriate: allergies, current medications, past family history, past medical history, past social history, past surgical history and problem list. Problem list updated.  Objective:   Filed Vitals:   11/27/15 0900  BP: 125/74  Pulse: 80  Temp: 98 F (36.7 C)  Weight: 195 lb 14.4 oz (88.86 kg)    Fetal Status: Fetal Heart Rate (bpm): 142 Fundal Height: 28 cm Movement: Present     General:  Alert, oriented and cooperative. Patient is in no acute distress.  Skin: Skin is warm and dry. No rash noted.   Cardiovascular: Normal heart rate noted  Respiratory: Normal respiratory effort, no problems with respiration noted; CTAB  Abdomen: Soft, gravid, appropriate for gestational age. Pain/Pressure: Absent     Pelvic: Vag. Bleeding: None     Cervical exam deferred        Extremities: Normal range of motion.  Edema: None  Mental Status: Normal mood and affect. Normal behavior. Normal judgment and thought content.   HEENT:  Ears clear, throat clear    Urinalysis: Urine Protein: Negative Urine Glucose: Negative  Assessment and Plan:  Pregnancy: G2P1001 at [redacted]w[redacted]d  1. Uterine leiomyoma, unspecified location -Needs c/s between 36-37 weeks (timed with fellow assit and not weekend)  2-CHTN BP  Meds reordered  3-Viral URI -Symptom relief -Mucinex DM   Preterm labor symptoms and general obstetric precautions including but not limited to vaginal bleeding, contractions, leaking of fluid and fetal movement were reviewed in detail with the patient. Please refer to After Visit Summary for other counseling recommendations.  Return in about 3 weeks (around 12/18/2015).   Guss Bunde, MD

## 2015-11-27 NOTE — Progress Notes (Signed)
C/o nonproductive cough at first , now productive cough of yellowish.

## 2015-11-27 NOTE — Telephone Encounter (Signed)
Dr.Leggett forgot to tell this patient to start Mucinex DM for cough. I attempted to call patient but there was no answer on numbers listed in chart.

## 2015-11-28 ENCOUNTER — Ambulatory Visit (HOSPITAL_COMMUNITY)
Admission: RE | Admit: 2015-11-28 | Discharge: 2015-11-28 | Disposition: A | Discharge status: Home / Self Care | Payer: Medicaid Other | Source: Ambulatory Visit | Attending: Physician Assistant | Admitting: Physician Assistant

## 2015-11-28 ENCOUNTER — Encounter: Payer: Self-pay | Admitting: Family Medicine

## 2015-11-28 ENCOUNTER — Other Ambulatory Visit (HOSPITAL_COMMUNITY): Payer: Self-pay | Admitting: Maternal and Fetal Medicine

## 2015-11-28 VITALS — BP 130/68 | HR 78 | Wt 197.8 lb

## 2015-11-28 DIAGNOSIS — O34219 Maternal care for unspecified type scar from previous cesarean delivery: Secondary | ICD-10-CM | POA: Diagnosis not present

## 2015-11-28 DIAGNOSIS — Z36 Encounter for antenatal screening of mother: Secondary | ICD-10-CM | POA: Insufficient documentation

## 2015-11-28 DIAGNOSIS — Z3A25 25 weeks gestation of pregnancy: Secondary | ICD-10-CM | POA: Insufficient documentation

## 2015-11-28 DIAGNOSIS — O09522 Supervision of elderly multigravida, second trimester: Secondary | ICD-10-CM | POA: Diagnosis present

## 2015-11-28 DIAGNOSIS — Z1389 Encounter for screening for other disorder: Secondary | ICD-10-CM

## 2015-11-28 DIAGNOSIS — O3429 Maternal care due to uterine scar from other previous surgery: Secondary | ICD-10-CM

## 2015-11-28 DIAGNOSIS — O09529 Supervision of elderly multigravida, unspecified trimester: Secondary | ICD-10-CM

## 2015-11-28 DIAGNOSIS — O10012 Pre-existing essential hypertension complicating pregnancy, second trimester: Secondary | ICD-10-CM | POA: Insufficient documentation

## 2015-11-28 DIAGNOSIS — O3412 Maternal care for benign tumor of corpus uteri, second trimester: Secondary | ICD-10-CM

## 2015-11-28 DIAGNOSIS — D259 Leiomyoma of uterus, unspecified: Secondary | ICD-10-CM

## 2015-11-28 DIAGNOSIS — Z363 Encounter for antenatal screening for malformations: Secondary | ICD-10-CM

## 2015-11-28 NOTE — Telephone Encounter (Signed)
No answer or voicemail to leave message 

## 2015-12-04 NOTE — Telephone Encounter (Signed)
No answer

## 2015-12-18 ENCOUNTER — Ambulatory Visit (INDEPENDENT_AMBULATORY_CARE_PROVIDER_SITE_OTHER): Payer: Medicaid Other | Admitting: Family Medicine

## 2015-12-18 VITALS — BP 126/77 | HR 89 | Temp 98.5°F | Wt 197.3 lb

## 2015-12-18 DIAGNOSIS — D259 Leiomyoma of uterus, unspecified: Secondary | ICD-10-CM | POA: Diagnosis not present

## 2015-12-18 DIAGNOSIS — Z23 Encounter for immunization: Secondary | ICD-10-CM | POA: Diagnosis not present

## 2015-12-18 DIAGNOSIS — O10913 Unspecified pre-existing hypertension complicating pregnancy, third trimester: Secondary | ICD-10-CM

## 2015-12-18 DIAGNOSIS — Z98891 History of uterine scar from previous surgery: Secondary | ICD-10-CM | POA: Diagnosis not present

## 2015-12-18 DIAGNOSIS — Z8759 Personal history of other complications of pregnancy, childbirth and the puerperium: Secondary | ICD-10-CM | POA: Diagnosis not present

## 2015-12-18 DIAGNOSIS — O09522 Supervision of elderly multigravida, second trimester: Secondary | ICD-10-CM

## 2015-12-18 DIAGNOSIS — N90810 Female genital mutilation status, unspecified: Secondary | ICD-10-CM

## 2015-12-18 DIAGNOSIS — O0993 Supervision of high risk pregnancy, unspecified, third trimester: Secondary | ICD-10-CM

## 2015-12-18 DIAGNOSIS — I10 Essential (primary) hypertension: Secondary | ICD-10-CM

## 2015-12-18 LAB — CBC
HCT: 31.2 % — ABNORMAL LOW (ref 36.0–46.0)
Hemoglobin: 10.3 g/dL — ABNORMAL LOW (ref 12.0–15.0)
MCH: 27.7 pg (ref 26.0–34.0)
MCHC: 33 g/dL (ref 30.0–36.0)
MCV: 83.9 fL (ref 78.0–100.0)
MPV: 9.9 fL (ref 8.6–12.4)
PLATELETS: 239 10*3/uL (ref 150–400)
RBC: 3.72 MIL/uL — AB (ref 3.87–5.11)
RDW: 13.3 % (ref 11.5–15.5)
WBC: 5.8 10*3/uL (ref 4.0–10.5)

## 2015-12-18 LAB — POCT URINALYSIS DIP (DEVICE)
Bilirubin Urine: NEGATIVE
Glucose, UA: NEGATIVE mg/dL
Ketones, ur: NEGATIVE mg/dL
NITRITE: NEGATIVE
PH: 6 (ref 5.0–8.0)
Protein, ur: NEGATIVE mg/dL
Specific Gravity, Urine: 1.025 (ref 1.005–1.030)
Urobilinogen, UA: 0.2 mg/dL (ref 0.0–1.0)

## 2015-12-18 MED ORDER — TETANUS-DIPHTH-ACELL PERTUSSIS 5-2.5-18.5 LF-MCG/0.5 IM SUSP
0.5000 mL | Freq: Once | INTRAMUSCULAR | Status: AC
  Administered 2015-12-18: 0.5 mL via INTRAMUSCULAR

## 2015-12-18 NOTE — Progress Notes (Signed)
Subjective:  Lindsey Dean is a 40 y.o. G2P1001 at [redacted]w[redacted]d being seen today for ongoing prenatal care.  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  Patient reports no complaints.  Contractions: Not present. Vag. Bleeding: None.  Movement: Present. Denies leaking of fluid.   The following portions of the patient's history were reviewed and updated as appropriate: allergies, current medications, past family history, past medical history, past social history, past surgical history and problem list. Problem list updated.  Objective:   Filed Vitals:   12/18/15 0850  BP: 126/77  Pulse: 89  Temp: 98.5 F (36.9 C)  Weight: 197 lb 4.8 oz (89.495 kg)    Fetal Status: Fetal Heart Rate (bpm): 150 Fundal Height: 29 cm Movement: Present     General:  Alert, oriented and cooperative. Patient is in no acute distress.  Skin: Skin is warm and dry. No rash noted.   Cardiovascular: Normal heart rate noted  Respiratory: Normal respiratory effort, no problems with respiration noted  Abdomen: Soft, gravid, appropriate for gestational age. Pain/Pressure: Absent     Pelvic: Vag. Bleeding: None     Cervical exam deferred        Extremities: Normal range of motion.  Edema: None  Mental Status: Normal mood and affect. Normal behavior. Normal judgment and thought content.   Urinalysis: Urine Protein: Negative Urine Glucose: Negative  Assessment and Plan:  Pregnancy: G2P1001 at [redacted]w[redacted]d  1. H/O cesarean section - will need CS scheduled at 37wk for h/o myomectomy  2. Female circumcision  3. Advanced maternal age in multigravida, second trimester Genetic testing wnl  4. History of pregnancy  induced hypertension On ASA  5. Supervision of high risk pregnancy, antepartum, third trimester UTD 28 week labs today  Preterm labor symptoms and general obstetric precautions including but not limited to vaginal bleeding, contractions, leaking of fluid and fetal movement were reviewed in detail with the patient. Please refer to After Visit Summary for other counseling recommendations.  Return in about 2 weeks (around 01/01/2016) for Routine prenatal care.   Caren Macadam, MD

## 2015-12-18 NOTE — Patient Instructions (Addendum)

## 2015-12-19 LAB — GLUCOSE TOLERANCE, 1 HOUR (50G) W/O FASTING: GLUCOSE 1 HOUR GTT: 115 mg/dL (ref 70–140)

## 2015-12-19 LAB — HIV ANTIBODY (ROUTINE TESTING W REFLEX): HIV: NONREACTIVE

## 2015-12-19 LAB — RPR

## 2015-12-27 ENCOUNTER — Other Ambulatory Visit (HOSPITAL_COMMUNITY): Payer: Self-pay | Admitting: Maternal and Fetal Medicine

## 2015-12-27 ENCOUNTER — Ambulatory Visit (HOSPITAL_COMMUNITY)
Admission: RE | Admit: 2015-12-27 | Discharge: 2015-12-27 | Disposition: A | Discharge status: Home / Self Care | Payer: Medicaid Other | Source: Ambulatory Visit | Attending: Family Medicine | Admitting: Family Medicine

## 2015-12-27 ENCOUNTER — Encounter (HOSPITAL_COMMUNITY): Payer: Self-pay

## 2015-12-27 VITALS — BP 126/87 | HR 86 | Wt 197.4 lb

## 2015-12-27 DIAGNOSIS — O3413 Maternal care for benign tumor of corpus uteri, third trimester: Secondary | ICD-10-CM

## 2015-12-27 DIAGNOSIS — O09523 Supervision of elderly multigravida, third trimester: Secondary | ICD-10-CM | POA: Diagnosis not present

## 2015-12-27 DIAGNOSIS — O34219 Maternal care for unspecified type scar from previous cesarean delivery: Secondary | ICD-10-CM

## 2015-12-27 DIAGNOSIS — D259 Leiomyoma of uterus, unspecified: Secondary | ICD-10-CM

## 2015-12-27 DIAGNOSIS — O10013 Pre-existing essential hypertension complicating pregnancy, third trimester: Secondary | ICD-10-CM

## 2015-12-27 DIAGNOSIS — O3429 Maternal care due to uterine scar from other previous surgery: Secondary | ICD-10-CM

## 2015-12-27 DIAGNOSIS — Z3A29 29 weeks gestation of pregnancy: Secondary | ICD-10-CM

## 2015-12-27 DIAGNOSIS — O09522 Supervision of elderly multigravida, second trimester: Secondary | ICD-10-CM

## 2015-12-27 DIAGNOSIS — O10012 Pre-existing essential hypertension complicating pregnancy, second trimester: Secondary | ICD-10-CM

## 2016-01-01 ENCOUNTER — Ambulatory Visit (INDEPENDENT_AMBULATORY_CARE_PROVIDER_SITE_OTHER): Payer: Medicaid Other | Admitting: Family Medicine

## 2016-01-01 ENCOUNTER — Encounter: Payer: Self-pay | Admitting: Family Medicine

## 2016-01-01 VITALS — BP 133/87 | HR 93 | Temp 98.8°F | Wt 194.8 lb

## 2016-01-01 DIAGNOSIS — O34219 Maternal care for unspecified type scar from previous cesarean delivery: Secondary | ICD-10-CM | POA: Diagnosis not present

## 2016-01-01 DIAGNOSIS — O0993 Supervision of high risk pregnancy, unspecified, third trimester: Secondary | ICD-10-CM

## 2016-01-01 DIAGNOSIS — K219 Gastro-esophageal reflux disease without esophagitis: Secondary | ICD-10-CM

## 2016-01-01 DIAGNOSIS — O10013 Pre-existing essential hypertension complicating pregnancy, third trimester: Secondary | ICD-10-CM

## 2016-01-01 LAB — POCT URINALYSIS DIP (DEVICE)
Bilirubin Urine: NEGATIVE
GLUCOSE, UA: NEGATIVE mg/dL
Hgb urine dipstick: NEGATIVE
Ketones, ur: NEGATIVE mg/dL
LEUKOCYTES UA: NEGATIVE
NITRITE: NEGATIVE
PROTEIN: NEGATIVE mg/dL
UROBILINOGEN UA: 0.2 mg/dL (ref 0.0–1.0)
pH: 6 (ref 5.0–8.0)

## 2016-01-01 MED ORDER — OMEPRAZOLE MAGNESIUM 20 MG PO TBEC: 20.0000 mg | DELAYED_RELEASE_TABLET | Freq: Every day | ORAL | Status: AC

## 2016-01-01 NOTE — Patient Instructions (Signed)
Breastfeeding Deciding to breastfeed is one of the best choices you can make for you and your baby. A change in hormones during pregnancy causes your breast tissue to grow and increases the number and size of your milk ducts. These hormones also allow proteins, sugars, and fats from your blood supply to make breast milk in your milk-producing glands. Hormones prevent breast milk from being released before your baby is born as well as prompt milk flow after birth. Once breastfeeding has begun, thoughts of your baby, as well as his or her sucking or crying, can stimulate the release of milk from your milk-producing glands.  BENEFITS OF BREASTFEEDING For Your Baby  Your first milk (colostrum) helps your baby's digestive system function better.  There are antibodies in your milk that help your baby fight off infections.  Your baby has a lower incidence of asthma, allergies, and sudden infant death syndrome.  The nutrients in breast milk are better for your baby than infant formulas and are designed uniquely for your baby's needs.  Breast milk improves your baby's brain development.  Your baby is less likely to develop other conditions, such as childhood obesity, asthma, or type 2 diabetes mellitus. For You  Breastfeeding helps to create a very special bond between you and your baby.  Breastfeeding is convenient. Breast milk is always available at the correct temperature and costs nothing.  Breastfeeding helps to burn calories and helps you lose the weight gained during pregnancy.  Breastfeeding makes your uterus contract to its prepregnancy size faster and slows bleeding (lochia) after you give birth.   Breastfeeding helps to lower your risk of developing type 2 diabetes mellitus, osteoporosis, and breast or ovarian cancer later in life. SIGNS THAT YOUR BABY IS HUNGRY Early Signs of Hunger  Increased alertness or activity.  Stretching.  Movement of the head from side to  side.  Movement of the head and opening of the mouth when the corner of the mouth or cheek is stroked (rooting).  Increased sucking sounds, smacking lips, cooing, sighing, or squeaking.  Hand-to-mouth movements.  Increased sucking of fingers or hands. Late Signs of Hunger  Fussing.  Intermittent crying. Extreme Signs of Hunger Signs of extreme hunger will require calming and consoling before your baby will be able to breastfeed successfully. Do not wait for the following signs of extreme hunger to occur before you initiate breastfeeding:  Restlessness.  A loud, strong cry.  Screaming. BREASTFEEDING BASICS Breastfeeding Initiation  Find a comfortable place to sit or lie down, with your neck and back well supported.  Place a pillow or rolled up blanket under your baby to bring him or her to the level of your breast (if you are seated). Nursing pillows are specially designed to help support your arms and your baby while you breastfeed.  Make sure that your baby's abdomen is facing your abdomen.  Gently massage your breast. With your fingertips, massage from your chest wall toward your nipple in a circular motion. This encourages milk flow. You may need to continue this action during the feeding if your milk flows slowly.  Support your breast with 4 fingers underneath and your thumb above your nipple. Make sure your fingers are well away from your nipple and your baby's mouth.  Stroke your baby's lips gently with your finger or nipple.  When your baby's mouth is open wide enough, quickly bring your baby to your breast, placing your entire nipple and as much of the colored area around your nipple (  areola) as possible into your baby's mouth.  More areola should be visible above your baby's upper lip than below the lower lip.  Your baby's tongue should be between his or her lower gum and your breast.  Ensure that your baby's mouth is correctly positioned around your nipple  (latched). Your baby's lips should create a seal on your breast and be turned out (everted).  It is common for your baby to suck about 2-3 minutes in order to start the flow of breast milk. Latching Teaching your baby how to latch on to your breast properly is very important. An improper latch can cause nipple pain and decreased milk supply for you and poor weight gain in your baby. Also, if your baby is not latched onto your nipple properly, he or she may swallow some air during feeding. This can make your baby fussy. Burping your baby when you switch breasts during the feeding can help to get rid of the air. However, teaching your baby to latch on properly is still the best way to prevent fussiness from swallowing air while breastfeeding. Signs that your baby has successfully latched on to your nipple:  Silent tugging or silent sucking, without causing you pain.  Swallowing heard between every 3-4 sucks.  Muscle movement above and in front of his or her ears while sucking. Signs that your baby has not successfully latched on to nipple:  Sucking sounds or smacking sounds from your baby while breastfeeding.  Nipple pain. If you think your baby has not latched on correctly, slip your finger into the corner of your baby's mouth to break the suction and place it between your baby's gums. Attempt breastfeeding initiation again. Signs of Successful Breastfeeding Signs from your baby:  A gradual decrease in the number of sucks or complete cessation of sucking.  Falling asleep.  Relaxation of his or her body.  Retention of a small amount of milk in his or her mouth.  Letting go of your breast by himself or herself. Signs from you:  Breasts that have increased in firmness, weight, and size 1-3 hours after feeding.  Breasts that are softer immediately after breastfeeding.  Increased milk volume, as well as a change in milk consistency and color by the fifth day of breastfeeding.  Nipples  that are not sore, cracked, or bleeding. Signs That Your Baby is Getting Enough Milk  Wetting at least 3 diapers in a 24-hour period. The urine should be clear and pale yellow by age 5 days.  At least 3 stools in a 24-hour period by age 5 days. The stool should be soft and yellow.  At least 3 stools in a 24-hour period by age 7 days. The stool should be seedy and yellow.  No loss of weight greater than 10% of birth weight during the first 3 days of age.  Average weight gain of 4-7 ounces (113-198 g) per week after age 4 days.  Consistent daily weight gain by age 5 days, without weight loss after the age of 2 weeks. After a feeding, your baby may spit up a small amount. This is common. BREASTFEEDING FREQUENCY AND DURATION Frequent feeding will help you make more milk and can prevent sore nipples and breast engorgement. Breastfeed when you feel the need to reduce the fullness of your breasts or when your baby shows signs of hunger. This is called "breastfeeding on demand." Avoid introducing a pacifier to your baby while you are working to establish breastfeeding (the first 4-6 weeks   after your baby is born). After this time you may choose to use a pacifier. Research has shown that pacifier use during the first year of a baby's life decreases the risk of sudden infant death syndrome (SIDS). Allow your baby to feed on each breast as long as he or she wants. Breastfeed until your baby is finished feeding. When your baby unlatches or falls asleep while feeding from the first breast, offer the second breast. Because newborns are often sleepy in the first few weeks of life, you may need to awaken your baby to get him or her to feed. Breastfeeding times will vary from baby to baby. However, the following rules can serve as a guide to help you ensure that your baby is properly fed:  Newborns (babies 4 weeks of age or younger) may breastfeed every 1-3 hours.  Newborns should not go longer than 3 hours  during the day or 5 hours during the night without breastfeeding.  You should breastfeed your baby a minimum of 8 times in a 24-hour period until you begin to introduce solid foods to your baby at around 6 months of age. BREAST MILK PUMPING Pumping and storing breast milk allows you to ensure that your baby is exclusively fed your breast milk, even at times when you are unable to breastfeed. This is especially important if you are going back to work while you are still breastfeeding or when you are not able to be present during feedings. Your lactation consultant can give you guidelines on how long it is safe to store breast milk. A breast pump is a machine that allows you to pump milk from your breast into a sterile bottle. The pumped breast milk can then be stored in a refrigerator or freezer. Some breast pumps are operated by hand, while others use electricity. Ask your lactation consultant which type will work best for you. Breast pumps can be purchased, but some hospitals and breastfeeding support groups lease breast pumps on a monthly basis. A lactation consultant can teach you how to hand express breast milk, if you prefer not to use a pump. CARING FOR YOUR BREASTS WHILE YOU BREASTFEED Nipples can become dry, cracked, and sore while breastfeeding. The following recommendations can help keep your breasts moisturized and healthy:  Avoid using soap on your nipples.  Wear a supportive bra. Although not required, special nursing bras and tank tops are designed to allow access to your breasts for breastfeeding without taking off your entire bra or top. Avoid wearing underwire-style bras or extremely tight bras.  Air dry your nipples for 3-4minutes after each feeding.  Use only cotton bra pads to absorb leaked breast milk. Leaking of breast milk between feedings is normal.  Use lanolin on your nipples after breastfeeding. Lanolin helps to maintain your skin's normal moisture barrier. If you use  pure lanolin, you do not need to wash it off before feeding your baby again. Pure lanolin is not toxic to your baby. You may also hand express a few drops of breast milk and gently massage that milk into your nipples and allow the milk to air dry. In the first few weeks after giving birth, some women experience extremely full breasts (engorgement). Engorgement can make your breasts feel heavy, warm, and tender to the touch. Engorgement peaks within 3-5 days after you give birth. The following recommendations can help ease engorgement:  Completely empty your breasts while breastfeeding or pumping. You may want to start by applying warm, moist heat (in   the shower or with warm water-soaked hand towels) just before feeding or pumping. This increases circulation and helps the milk flow. If your baby does not completely empty your breasts while breastfeeding, pump any extra milk after he or she is finished.  Wear a snug bra (nursing or regular) or tank top for 1-2 days to signal your body to slightly decrease milk production.  Apply ice packs to your breasts, unless this is too uncomfortable for you.  Make sure that your baby is latched on and positioned properly while breastfeeding. If engorgement persists after 48 hours of following these recommendations, contact your health care provider or a lactation consultant. OVERALL HEALTH CARE RECOMMENDATIONS WHILE BREASTFEEDING  Eat healthy foods. Alternate between meals and snacks, eating 3 of each per day. Because what you eat affects your breast milk, some of the foods may make your baby more irritable than usual. Avoid eating these foods if you are sure that they are negatively affecting your baby.  Drink milk, fruit juice, and water to satisfy your thirst (about 10 glasses a day).  Rest often, relax, and continue to take your prenatal vitamins to prevent fatigue, stress, and anemia.  Continue breast self-awareness checks.  Avoid chewing and smoking  tobacco. Chemicals from cigarettes that pass into breast milk and exposure to secondhand smoke may harm your baby.  Avoid alcohol and drug use, including marijuana. Some medicines that may be harmful to your baby can pass through breast milk. It is important to ask your health care provider before taking any medicine, including all over-the-counter and prescription medicine as well as vitamin and herbal supplements. It is possible to become pregnant while breastfeeding. If birth control is desired, ask your health care provider about options that will be safe for your baby. SEEK MEDICAL CARE IF:  You feel like you want to stop breastfeeding or have become frustrated with breastfeeding.  You have painful breasts or nipples.  Your nipples are cracked or bleeding.  Your breasts are red, tender, or warm.  You have a swollen area on either breast.  You have a fever or chills.  You have nausea or vomiting.  You have drainage other than breast milk from your nipples.  Your breasts do not become full before feedings by the fifth day after you give birth.  You feel sad and depressed.  Your baby is too sleepy to eat well.  Your baby is having trouble sleeping.   Your baby is wetting less than 3 diapers in a 24-hour period.  Your baby has less than 3 stools in a 24-hour period.  Your baby's skin or the white part of his or her eyes becomes yellow.   Your baby is not gaining weight by 5 days of age. SEEK IMMEDIATE MEDICAL CARE IF:  Your baby is overly tired (lethargic) and does not want to wake up and feed.  Your baby develops an unexplained fever.   This information is not intended to replace advice given to you by your health care provider. Make sure you discuss any questions you have with your health care provider.   Document Released: 10/28/2005 Document Revised: 07/19/2015 Document Reviewed: 04/21/2013 Elsevier Interactive Patient Education 2016 [employer]  

## 2016-01-01 NOTE — Progress Notes (Signed)
Subjective:  Lindsey Dean is a 40 y.o. G2P1001 at [redacted]w[redacted]d being seen today for ongoing prenatal care.  She is currently monitored for the following issues for this high-risk pregnancy and has Advanced maternal age in multigravida; Fibroid, uterine; Previous cesarean delivery, antepartum; Essential hypertension antepartum; Essential hypertension; History of pregnancy induced hypertension; Supervision of high risk pregnancy, antepartum; and Female circumcision on her problem list.  Patient reports heartburn.  Contractions: Not present. Vag. Bleeding: None.  Movement: Present. Denies leaking of fluid.   The following portions of the patient's history were reviewed and updated as appropriate: allergies, current medications, past family history, past medical history, past social history, past surgical history and problem list. Problem list updated.  Objective:   Filed Vitals:   01/01/16 0847  BP: 133/87  Pulse: 93  Temp: 98.8 F (37.1 C)  Weight: 194 lb 12.8 oz (88.361 kg)    Fetal Status: Fetal Heart Rate (bpm): 140 Fundal Height: 30 cm Movement: Present     General:  Alert, oriented and cooperative. Patient is in no acute distress.  Skin: Skin is warm and dry. No rash noted.   Cardiovascular: Normal heart rate noted  Respiratory: Normal respiratory effort, no problems with respiration noted  Abdomen: Soft, gravid, appropriate for gestational age. Pain/Pressure: Present     Pelvic: Vag. Bleeding: None     Cervical exam deferred        Extremities: Normal range of motion.  Edema: None  Mental Status: Normal mood and affect. Normal behavior. Normal judgment and thought content.   Urinalysis:     Prot neg, gluc neg U/s for growth on 2/15 2 lb 14 oz (38%), normal fluid, vtx Assessment and Plan:  Pregnancy: G2P1001 at [redacted]w[redacted]d  1. Supervision of high risk pregnancy, antepartum, third trimester Continue prenatal care.  2. Previous cesarean delivery, antepartum Repeat scheduled at 37  wks  3. Essential hypertension antepartum, third trimester Continue labetalol and ASA  4. Gastroesophageal reflux disease without esophagitis Trial of Prilosec - omeprazole (PRILOSEC OTC) 20 MG tablet; Take 1 tablet (20 mg total) by mouth daily.  Dispense: 30 tablet; Refill: 3  Preterm labor symptoms and general obstetric precautions including but not limited to vaginal bleeding, contractions, leaking of fluid and fetal movement were reviewed in detail with the patient. Please refer to After Visit Summary for other counseling recommendations.  Return in 2 weeks (on 01/15/2016).   Donnamae Jude, MD

## 2016-01-01 NOTE — Progress Notes (Signed)
Breastfeeding tip of the week reviewed. 

## 2016-01-02 ENCOUNTER — Encounter (HOSPITAL_COMMUNITY): Payer: Self-pay | Admitting: *Deleted

## 2016-01-15 ENCOUNTER — Encounter: Payer: Self-pay | Admitting: Obstetrics and Gynecology

## 2016-01-15 ENCOUNTER — Ambulatory Visit (INDEPENDENT_AMBULATORY_CARE_PROVIDER_SITE_OTHER): Payer: Medicaid Other | Admitting: Obstetrics and Gynecology

## 2016-01-15 VITALS — BP 142/91 | HR 90 | Temp 98.0°F | Wt 195.3 lb

## 2016-01-15 DIAGNOSIS — D259 Leiomyoma of uterus, unspecified: Secondary | ICD-10-CM | POA: Diagnosis not present

## 2016-01-15 DIAGNOSIS — O34219 Maternal care for unspecified type scar from previous cesarean delivery: Secondary | ICD-10-CM

## 2016-01-15 DIAGNOSIS — O10013 Pre-existing essential hypertension complicating pregnancy, third trimester: Secondary | ICD-10-CM | POA: Diagnosis not present

## 2016-01-15 DIAGNOSIS — I10 Essential (primary) hypertension: Secondary | ICD-10-CM

## 2016-01-15 DIAGNOSIS — O0993 Supervision of high risk pregnancy, unspecified, third trimester: Secondary | ICD-10-CM

## 2016-01-15 DIAGNOSIS — O3413 Maternal care for benign tumor of corpus uteri, third trimester: Secondary | ICD-10-CM | POA: Diagnosis not present

## 2016-01-15 DIAGNOSIS — Z8759 Personal history of other complications of pregnancy, childbirth and the puerperium: Secondary | ICD-10-CM

## 2016-01-15 DIAGNOSIS — O09523 Supervision of elderly multigravida, third trimester: Secondary | ICD-10-CM

## 2016-01-15 NOTE — Progress Notes (Signed)
Subjective:  Lindsey Dean is a 40 y.o. G2P1001 at [redacted]w[redacted]d being seen today for ongoing prenatal care.  She is currently monitored for the following issues for this high-risk pregnancy and has Advanced maternal age in multigravida; Fibroid, uterine; Previous cesarean delivery, antepartum; Essential hypertension antepartum; Essential hypertension; History of pregnancy induced hypertension; Supervision of high risk pregnancy, antepartum; and Female circumcision on her problem list.  Patient reports no complaints and denies HA, visual changes, RUQ/epigastric pain, nausea or emesis.  Contractions: Not present. Vag. Bleeding: None.  Movement: Present. Denies leaking of fluid.   The following portions of the patient's history were reviewed and updated as appropriate: allergies, current medications, past family history, past medical history, past social history, past surgical history and problem list. Problem list updated.  Objective:   Filed Vitals:   01/15/16 0853 01/15/16 0856  BP: 141/87 142/91  Pulse: 90   Temp: 98 F (36.7 C)   Weight: 195 lb 4.8 oz (88.587 kg)     Fetal Status: Fetal Heart Rate (bpm): 136 Fundal Height: 34 cm Movement: Present     General:  Alert, oriented and cooperative. Patient is in no acute distress.  Skin: Skin is warm and dry. No rash noted.   Cardiovascular: Normal heart rate noted  Respiratory: Normal respiratory effort, no problems with respiration noted  Abdomen: Soft, gravid, appropriate for gestational age. Pain/Pressure: Absent     Pelvic: Vag. Bleeding: None     Cervical exam deferred        Extremities: Normal range of motion.  Edema: None  Mental Status: Normal mood and affect. Normal behavior. Normal judgment and thought content.   Urinalysis:      Assessment and Plan:  Pregnancy: G2P1001 at [redacted]w[redacted]d  1. Supervision of high risk pregnancy, antepartum, third trimester   2. Previous cesarean delivery, antepartum Scheduled for repeat at 37 weeks due  to previous myomectomy  3. History of pregnancy induced hypertension   4. Uterine leiomyoma, unspecified location   5. Essential hypertension antepartum, third trimester Continue labetalol 200 BID Will check labs today Patient reports taking BP meds regularly and took it this morning Growth [place] scheduled for 3/15 - CBC - Comprehensive metabolic panel - Protein, urine, 24 hour  6. Advanced maternal age in multigravida, third trimester   7. Essential hypertension   Preterm labor symptoms and general obstetric precautions including but not limited to vaginal bleeding, contractions, leaking of fluid and fetal movement were reviewed in detail with the patient. Please refer to After Visit Summary for other counseling recommendations.  Return in about 2 weeks (around 01/29/2016).   Mora Bellman, MD

## 2016-01-15 NOTE — Progress Notes (Signed)
Educated pt on Skin to Skin  Pt reports dizziness when lean forward at times

## 2016-01-22 ENCOUNTER — Other Ambulatory Visit: Payer: Medicaid Other

## 2016-01-22 LAB — COMPREHENSIVE METABOLIC PANEL
ALK PHOS: 81 U/L (ref 33–115)
ALT: 18 U/L (ref 6–29)
AST: 16 U/L (ref 10–30)
Albumin: 3.2 g/dL — ABNORMAL LOW (ref 3.6–5.1)
BUN: 7 mg/dL (ref 7–25)
CHLORIDE: 101 mmol/L (ref 98–110)
CO2: 24 mmol/L (ref 20–31)
CREATININE: 0.49 mg/dL — AB (ref 0.50–1.10)
Calcium: 9 mg/dL (ref 8.6–10.2)
GLUCOSE: 86 mg/dL (ref 65–99)
POTASSIUM: 4.1 mmol/L (ref 3.5–5.3)
SODIUM: 134 mmol/L — AB (ref 135–146)
TOTAL PROTEIN: 6.3 g/dL (ref 6.1–8.1)
Total Bilirubin: 0.3 mg/dL (ref 0.2–1.2)

## 2016-01-22 LAB — CBC
HCT: 30.8 % — ABNORMAL LOW (ref 36.0–46.0)
Hemoglobin: 10.1 g/dL — ABNORMAL LOW (ref 12.0–15.0)
MCH: 26 pg (ref 26.0–34.0)
MCHC: 32.8 g/dL (ref 30.0–36.0)
MCV: 79.4 fL (ref 78.0–100.0)
MPV: 9.7 fL (ref 8.6–12.4)
PLATELETS: 247 10*3/uL (ref 150–400)
RBC: 3.88 MIL/uL (ref 3.87–5.11)
RDW: 13.9 % (ref 11.5–15.5)
WBC: 6 10*3/uL (ref 4.0–10.5)

## 2016-01-23 LAB — PROTEIN, URINE, 24 HOUR
PROTEIN 24H UR: 180 mg/(24.h) — AB (ref <150)
PROTEIN, URINE: 6 mg/dL (ref 5–24)

## 2016-01-24 ENCOUNTER — Encounter (HOSPITAL_COMMUNITY): Payer: Self-pay

## 2016-01-24 ENCOUNTER — Ambulatory Visit (HOSPITAL_COMMUNITY)
Admission: RE | Admit: 2016-01-24 | Discharge: 2016-01-24 | Disposition: A | Discharge status: Home / Self Care | Payer: Medicaid Other | Source: Ambulatory Visit | Attending: Family Medicine | Admitting: Family Medicine

## 2016-01-24 ENCOUNTER — Other Ambulatory Visit (HOSPITAL_COMMUNITY): Payer: Self-pay | Admitting: Maternal and Fetal Medicine

## 2016-01-24 DIAGNOSIS — Z3A33 33 weeks gestation of pregnancy: Secondary | ICD-10-CM | POA: Diagnosis not present

## 2016-01-24 DIAGNOSIS — O09523 Supervision of elderly multigravida, third trimester: Secondary | ICD-10-CM

## 2016-01-24 DIAGNOSIS — O10013 Pre-existing essential hypertension complicating pregnancy, third trimester: Secondary | ICD-10-CM | POA: Diagnosis present

## 2016-01-24 DIAGNOSIS — O3429 Maternal care due to uterine scar from other previous surgery: Secondary | ICD-10-CM

## 2016-01-24 DIAGNOSIS — D259 Leiomyoma of uterus, unspecified: Secondary | ICD-10-CM

## 2016-01-24 DIAGNOSIS — O3410 Maternal care for benign tumor of corpus uteri, unspecified trimester: Secondary | ICD-10-CM

## 2016-01-24 DIAGNOSIS — O3413 Maternal care for benign tumor of corpus uteri, third trimester: Secondary | ICD-10-CM | POA: Insufficient documentation

## 2016-01-24 DIAGNOSIS — O34219 Maternal care for unspecified type scar from previous cesarean delivery: Secondary | ICD-10-CM

## 2016-01-29 ENCOUNTER — Ambulatory Visit (INDEPENDENT_AMBULATORY_CARE_PROVIDER_SITE_OTHER): Payer: Medicaid Other | Admitting: Family Medicine

## 2016-01-29 VITALS — BP 137/86 | HR 96 | Temp 98.5°F | Wt 199.4 lb

## 2016-01-29 DIAGNOSIS — O09523 Supervision of elderly multigravida, third trimester: Secondary | ICD-10-CM | POA: Diagnosis not present

## 2016-01-29 DIAGNOSIS — O10013 Pre-existing essential hypertension complicating pregnancy, third trimester: Secondary | ICD-10-CM

## 2016-01-29 DIAGNOSIS — D259 Leiomyoma of uterus, unspecified: Secondary | ICD-10-CM

## 2016-01-29 DIAGNOSIS — O34219 Maternal care for unspecified type scar from previous cesarean delivery: Secondary | ICD-10-CM | POA: Diagnosis not present

## 2016-01-29 DIAGNOSIS — N90810 Female genital mutilation status, unspecified: Secondary | ICD-10-CM | POA: Diagnosis not present

## 2016-01-29 DIAGNOSIS — O3413 Maternal care for benign tumor of corpus uteri, third trimester: Secondary | ICD-10-CM

## 2016-01-29 DIAGNOSIS — O0993 Supervision of high risk pregnancy, unspecified, third trimester: Secondary | ICD-10-CM

## 2016-01-29 LAB — POCT URINALYSIS DIP (DEVICE)
BILIRUBIN URINE: NEGATIVE
Glucose, UA: NEGATIVE mg/dL
HGB URINE DIPSTICK: NEGATIVE
KETONES UR: NEGATIVE mg/dL
Leukocytes, UA: NEGATIVE
Nitrite: NEGATIVE
PH: 6 (ref 5.0–8.0)
PROTEIN: NEGATIVE mg/dL
Specific Gravity, Urine: 1.02 (ref 1.005–1.030)
Urobilinogen, UA: 0.2 mg/dL (ref 0.0–1.0)

## 2016-01-29 NOTE — Progress Notes (Signed)
Breastfeeding tip of the week reviewed. 

## 2016-01-29 NOTE — Progress Notes (Signed)
Subjective:  Lindsey Dean is a 40 y.o. G2P1001 at [redacted]w[redacted]d being seen today for ongoing prenatal care.  She is currently monitored for the following issues for this high-risk pregnancy and has Advanced maternal age in multigravida; Fibroid, uterine; Previous cesarean delivery, antepartum; Essential hypertension antepartum; Essential hypertension; History of pregnancy induced hypertension; Supervision of high risk pregnancy, antepartum; and Female circumcision on her problem list.  Patient reports no complaints.  Contractions: Not present. Vag. Bleeding: None.  Movement: Present. Denies leaking of fluid.   Blood pressures at home have been wnl. Last checked 3 days ago was132/77. No PIH symtpoms. Taking medications (labetalol and aspirin).   The following portions of the patient's history were reviewed and updated as appropriate: allergies, current medications, past family history, past medical history, past social history, past surgical history and problem list. Problem list updated.  Objective:   Filed Vitals:   01/29/16 0857  BP: 137/86  Pulse: 96  Temp: 98.5 F (36.9 C)  Weight: 199 lb 6.4 oz (90.447 kg)    Fetal Status: Fetal Heart Rate (bpm): 145   Movement: Present     General:  Alert, oriented and cooperative. Patient is in no acute distress.  Skin: Skin is warm and dry. No rash noted.   Cardiovascular: Normal heart rate noted  Respiratory: Normal respiratory effort, no problems with respiration noted  Abdomen: Soft, gravid, appropriate for gestational age. Pain/Pressure: Absent     Pelvic: Vag. Bleeding: None     Cervical exam deferred        Extremities: Normal range of motion.  Edema: Trace  Mental Status: Normal mood and affect. Normal behavior. Normal judgment and thought content.   Urinalysis: Urine Protein: Negative Urine Glucose: Negative  Assessment and Plan:  Pregnancy: G2P1001 at [redacted]w[redacted]d  1. Essential hypertension antepartum, third trimester Well-controlled on  current therapy. No signs or preeclampsia.   2. Uterine leiomyoma, unspecified location H/o myomectomy. Multiple fibroids currently in pregnancy but not affecting fetus growth. Patient states she wants medical management of fibroids after pregnancy.  3. 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 Supervision of high risk pregnancy, antepartum, third trimester Continue routine prenatal care. Scheduled c-section for April 8th.  6. Previous cesarean delivery, antepartum  Preterm labor symptoms and general obstetric precautions including but not limited to vaginal bleeding, contractions, leaking of fluid and fetal movement were reviewed in detail with the patient. Please refer to After Visit Summary for other counseling recommendations.   Return in about 2 weeks (around 02/12/2016) for Routine prenatal care.   Katheren Shams, DO  PGY-2

## 2016-01-29 NOTE — Progress Notes (Signed)
OB fellow attestation:  I have seen and examined this patient; I agree with above documentation in the resident's note. We discussed the case in person and developed the assessment and plan together.   Caren Macadam, MD 1:43 PM

## 2016-01-29 NOTE — Patient Instructions (Signed)

## 2016-02-03 ENCOUNTER — Other Ambulatory Visit: Payer: Self-pay | Admitting: Obstetrics & Gynecology

## 2016-02-07 ENCOUNTER — Telehealth (HOSPITAL_COMMUNITY): Payer: Self-pay | Admitting: *Deleted

## 2016-02-07 NOTE — Telephone Encounter (Signed)
Preadmission screen  

## 2016-02-12 ENCOUNTER — Encounter (HOSPITAL_COMMUNITY): Payer: Self-pay | Admitting: *Deleted

## 2016-02-12 ENCOUNTER — Other Ambulatory Visit (HOSPITAL_COMMUNITY)
Admission: RE | Admit: 2016-02-12 | Discharge: 2016-02-12 | Disposition: A | Discharge status: Home / Self Care | Payer: Medicaid Other | Source: Ambulatory Visit | Attending: Family Medicine | Admitting: Family Medicine

## 2016-02-12 ENCOUNTER — Ambulatory Visit (INDEPENDENT_AMBULATORY_CARE_PROVIDER_SITE_OTHER): Payer: Medicaid Other | Admitting: Family Medicine

## 2016-02-12 VITALS — BP 137/75 | HR 70 | Temp 98.6°F | Wt 197.1 lb

## 2016-02-12 DIAGNOSIS — O34219 Maternal care for unspecified type scar from previous cesarean delivery: Secondary | ICD-10-CM

## 2016-02-12 DIAGNOSIS — O09523 Supervision of elderly multigravida, third trimester: Secondary | ICD-10-CM | POA: Diagnosis not present

## 2016-02-12 DIAGNOSIS — O0993 Supervision of high risk pregnancy, unspecified, third trimester: Secondary | ICD-10-CM

## 2016-02-12 DIAGNOSIS — O10013 Pre-existing essential hypertension complicating pregnancy, third trimester: Secondary | ICD-10-CM | POA: Diagnosis present

## 2016-02-12 DIAGNOSIS — Z113 Encounter for screening for infections with a predominantly sexual mode of transmission: Secondary | ICD-10-CM | POA: Insufficient documentation

## 2016-02-12 LAB — POCT URINALYSIS DIP (DEVICE)
Bilirubin Urine: NEGATIVE
GLUCOSE, UA: NEGATIVE mg/dL
Hgb urine dipstick: NEGATIVE
Ketones, ur: NEGATIVE mg/dL
LEUKOCYTES UA: NEGATIVE
NITRITE: NEGATIVE
Protein, ur: NEGATIVE mg/dL
Specific Gravity, Urine: 1.015 (ref 1.005–1.030)
UROBILINOGEN UA: 0.2 mg/dL (ref 0.0–1.0)
pH: 6 (ref 5.0–8.0)

## 2016-02-12 LAB — OB RESULTS CONSOLE GC/CHLAMYDIA: Gonorrhea: NEGATIVE

## 2016-02-12 LAB — OB RESULTS CONSOLE GBS: GBS: NEGATIVE

## 2016-02-12 NOTE — Progress Notes (Signed)
Subjective:  Lindsey Dean is a 40 y.o. G2P1001 at [redacted]w[redacted]d being seen today for ongoing prenatal care.  She is currently monitored for the following issues for this high-risk pregnancy and has Advanced maternal age in multigravida; Fibroid, uterine; Previous cesarean delivery, antepartum; Essential hypertension antepartum; Essential hypertension; History of pregnancy induced hypertension; Supervision of high risk pregnancy, antepartum; and Female circumcision on her problem list.  Patient reports no complaints.  Contractions: Not present. Vag. Bleeding: None.  Movement: Present. Denies leaking of fluid.   The following portions of the patient's history were reviewed and updated as appropriate: allergies, current medications, past family history, past medical history, past social history, past surgical history and problem list. Problem list updated.  Objective:   Filed Vitals:   02/12/16 0943  BP: 137/75  Pulse: 70  Temp: 98.6 F (37 C)  Weight: 197 lb 1.6 oz (89.404 kg)    Fetal Status: Fetal Heart Rate (bpm): 145   Movement: Present     General:  Alert, oriented and cooperative. Patient is in no acute distress.  Skin: Skin is warm and dry. No rash noted.   Cardiovascular: Normal heart rate noted  Respiratory: Normal respiratory effort, no problems with respiration noted  Abdomen: Soft, gravid, appropriate for gestational age. Pain/Pressure: Present     Pelvic: Vag. Bleeding: None     Cervical exam deferred        Extremities: Normal range of motion.  Edema: None  Mental Status: Normal mood and affect. Normal behavior. Normal judgment and thought content.   Urinalysis: Urine Protein: Negative Urine Glucose: Negative  Assessment and Plan:  Pregnancy: G2P1001 at [redacted]w[redacted]d  1. Essential hypertension antepartum, third trimester - controlled today  2. Advanced maternal age in multigravida, third trimester  3. Previous cesarean delivery, antepartum -Repeat CS scheduled  4. Supervision  of high risk pregnancy, antepartum, third trimester -Updated - Obtained 36 wk labs  Preterm labor symptoms and general obstetric precautions including but not limited to vaginal bleeding, contractions, leaking of fluid and fetal movement were reviewed in detail with the patient. Please refer to After Visit Summary for other counseling recommendations.   Return for NST2x weekly.  Future Appointments Date Time Provider [place]  02/16/2016 8:00 AM [facility] PAT 5 [facility] None    Caren Macadam, MD

## 2016-02-12 NOTE — Patient Instructions (Signed)
Cesarean Delivery Cesarean delivery is the birth of a baby through a cut (incision) in the abdomen and womb (uterus).  LET YOUR HEALTH CARE PROVIDER KNOW ABOUT:  All medicines you are taking, including vitamins, herbs, eye drops, creams, and over-the-counter medicines.  Previous problems you or members of your family have had with the use of anesthetics.  Any bleeding or blood clotting disorders you have.  Family history of blood clots or bleeding disorders.  Any history of deep vein thrombosis (DVT) or pulmonary embolism (PE).  Previous surgeries you have had.  Medical conditions you have.  Any allergies you have.  Complicationsinvolving the pregnancy. RISKS AND COMPLICATIONS  Generally, this is a safe procedure. However, as with any procedure, complications can occur. Possible complications include:  Bleeding.  Infection.  Blood clots.  Injury to surrounding organs.  Problems with anesthesia.  Injury to the baby. BEFORE THE PROCEDURE   You may be given an antacid medicine to drink. This will prevent acid contents in your stomach from going into your lungs if you vomit during the surgery.  You may be given an antibiotic medicine to prevent infection. PROCEDURE   To prevent infection of your incision:  Hair may be removed from your pubic area if it is near your incision.  The skin of your pubic area and lower abdomen will be cleaned with a germ-killing solution (antiseptic).  A tube (Foley catheter) will be placed in your bladder to drain your urine from your bladder into a bag. This keeps your bladder empty during surgery.  An IV tube will be placed in your vein.  You may be given medicine to numb the lower half of your body (regional anesthetic). If you were in labor, you may have already had an epidural in place which can be used in both labor and cesarean delivery. You may possibly be given medicine to make you sleep (general anesthetic) though this is not as  common.  Your heart rate and your baby's heart rate will be monitored.  An incision will be made in your abdomen that extends to your uterus. There are 2 basic kinds of incisions:  The horizontal (transverse) incision. Horizontal incisions are from side to side and are used for most routine cesarean deliveries.  The vertical incision. The vertical incision is from the top of the abdomen to the bottom and is less commonly used. It is often done for women who have a serious complication (extreme prematurity) or under emergency situations.  The horizontal and vertical incisions may both be used at the same time. However, this is very uncommon.  An incision is then made in your uterus to deliver the baby.  Your baby will be delivered.  Your health care provider may place the baby on your chest. It is important to keep the baby warm. Your health care provider will dry off the baby, place the baby directly on your bare skin, and cover the baby with warm, dry blankets.  Both incisions will be closed with absorbable stitches. AFTER THE PROCEDURE   If you were awake during the surgery, you will see your baby right away. If you were asleep, you will see your baby as soon as you are awake.  You may breastfeed your baby after surgery.  You may be able to get up and walk the same day as the surgery. If you need to stay in bed for a period of time, you will receive help to turn, cough, and take deep breaths after   surgery. This helps prevent lung problems such as pneumonia.  Do not get out of bed alone the first time after surgery. You will need help getting out of bed until you are able to do this by yourself.  You may be able to shower the day after your cesarean delivery. After the bandage (dressing) is taken off the incision site, a nurse will assist you to shower if you would like help.  You may be directed to take actions to help prevent blood clots in your legs. These may  include:  Walking shortly after surgery, with someone assisting you. Moving around after surgery helps to improve blood flow.  Wearing compression stockings or using different types of devices.  Taking medicines to thin your blood (anticoagulants) if you are at high risk for DVT or PE.  Save any blood clots that you pass from your vagina. If you pass a clot while on the toilet, do not flush it. Call for the nurse. Tell the nurse if you think you are bleeding too much or passing too many clots.  You will be given medicine for pain and nausea as needed. Let your health care providers know if you are hurting. You may also be given an antibiotic to prevent an infection.  Your IV tube will be taken out when you are drinking a reasonable amount of fluids. The Foley catheter is taken out when you are up and walking.  If your blood type is Rh negative and your baby's blood type is Rh positive, you will be given a shot of anti-D immune globulin. This shot prevents you from having Rh problems with a future pregnancy. You should get the shot even if you had your tubes tied (tubal ligation).  If you are allowed to take the baby for a walk, place the baby in the bassinet and push it.   This information is not intended to replace advice given to you by your health care provider. Make sure you discuss any questions you have with your health care provider.   Document Released: 10/28/2005 Document Revised: 07/19/2015 Document Reviewed: 06/24/2012 Elsevier Interactive Patient Education 2016 [employer]  

## 2016-02-12 NOTE — Progress Notes (Signed)
Breastfeeding tip of the week reviewed Cultures today 

## 2016-02-13 ENCOUNTER — Telehealth: Payer: Self-pay | Admitting: *Deleted

## 2016-02-13 LAB — GC/CHLAMYDIA PROBE AMP (~~LOC~~) NOT AT ARMC
Chlamydia: NEGATIVE
Neisseria Gonorrhea: NEGATIVE

## 2016-02-13 NOTE — Telephone Encounter (Signed)
Called pt and her husband answered.  He stated that he is @ work and provided alternate telephone number for [employer] [phone].  I called that number but it was invalid, therefore tried [phone] and left message that I am calling regarding another appt needed. Please call back and leave message on nurse voice mail stating when she can be reached. *Pt needs to be informed that she has been scheduled for NST on 4/6 @ 0830.

## 2016-02-13 NOTE — Telephone Encounter (Signed)
Attempted to reach this patient but number listed in chart in invalid at this time

## 2016-02-14 NOTE — Telephone Encounter (Signed)
Called pt and left message that we need to speak with her today by 4pm.  Please call back and leave message of when she can be reached or if there is a different telephone number for her.

## 2016-02-14 NOTE — Telephone Encounter (Signed)
Patient called and stated she missed a phone call from this office. Patient has an appointment 04/06 at 8:30 am for NST testing. Patient stated she understood, and she would come to her appointment.

## 2016-02-15 ENCOUNTER — Ambulatory Visit (INDEPENDENT_AMBULATORY_CARE_PROVIDER_SITE_OTHER): Payer: Medicaid Other | Admitting: *Deleted

## 2016-02-15 VITALS — BP 121/74 | HR 85

## 2016-02-15 DIAGNOSIS — O10013 Pre-existing essential hypertension complicating pregnancy, third trimester: Secondary | ICD-10-CM | POA: Diagnosis present

## 2016-02-15 LAB — CULTURE, BETA STREP (GROUP B ONLY): Previous Result: NORMAL

## 2016-02-15 NOTE — Progress Notes (Signed)
NST reviewed and reactive.  Modean Mccullum L. Harraway-Smith, M.D., FACOG    

## 2016-02-15 NOTE — Progress Notes (Signed)
Rpt C/S scheduled 4/8.

## 2016-02-16 ENCOUNTER — Encounter (HOSPITAL_COMMUNITY)
Admission: RE | Admit: 2016-02-16 | Discharge: 2016-02-16 | Disposition: A | Discharge status: Home / Self Care | Payer: Medicaid Other | Source: Ambulatory Visit | Attending: Obstetrics & Gynecology | Admitting: Obstetrics & Gynecology

## 2016-02-16 LAB — CBC
HCT: 31.3 % — ABNORMAL LOW (ref 36.0–46.0)
Hemoglobin: 10 g/dL — ABNORMAL LOW (ref 12.0–15.0)
MCH: 24.8 pg — ABNORMAL LOW (ref 26.0–34.0)
MCHC: 31.9 g/dL (ref 30.0–36.0)
MCV: 77.5 fL — ABNORMAL LOW (ref 78.0–100.0)
PLATELETS: 239 10*3/uL (ref 150–400)
RBC: 4.04 MIL/uL (ref 3.87–5.11)
RDW: 14.5 % (ref 11.5–15.5)
WBC: 4.7 10*3/uL (ref 4.0–10.5)

## 2016-02-16 LAB — BASIC METABOLIC PANEL
ANION GAP: 9 (ref 5–15)
BUN: 8 mg/dL (ref 6–20)
CALCIUM: 9 mg/dL (ref 8.9–10.3)
CO2: 21 mmol/L — AB (ref 22–32)
Chloride: 107 mmol/L (ref 101–111)
Creatinine, Ser: 0.62 mg/dL (ref 0.44–1.00)
GFR calc Af Amer: >60 mL/min (ref >60)
GFR calc non Af Amer: >60 mL/min (ref >60)
GLUCOSE: 102 mg/dL — AB (ref 65–99)
Potassium: 3.8 mmol/L (ref 3.5–5.1)
Sodium: 137 mmol/L (ref 135–145)

## 2016-02-17 ENCOUNTER — Inpatient Hospital Stay (HOSPITAL_COMMUNITY): Payer: Medicaid Other | Admitting: Anesthesiology

## 2016-02-17 ENCOUNTER — Inpatient Hospital Stay (HOSPITAL_COMMUNITY)
Admission: RE | Admit: 2016-02-17 | Payer: Medicaid Other | Source: Ambulatory Visit | Admitting: Obstetrics & Gynecology

## 2016-02-17 ENCOUNTER — Encounter (HOSPITAL_COMMUNITY): Payer: Self-pay | Admitting: *Deleted

## 2016-02-17 ENCOUNTER — Encounter (HOSPITAL_COMMUNITY)
Admission: RE | Disposition: A | Discharge status: Home / Self Care | Payer: Self-pay | Source: Ambulatory Visit | Attending: Obstetrics & Gynecology

## 2016-02-17 ENCOUNTER — Inpatient Hospital Stay (HOSPITAL_COMMUNITY)
Admission: RE | Admit: 2016-02-17 | Discharge: 2016-02-20 | DRG: 765 | Disposition: A | Discharge status: Home / Self Care | Payer: Medicaid Other | Source: Ambulatory Visit | Attending: Obstetrics & Gynecology | Admitting: Obstetrics & Gynecology

## 2016-02-17 DIAGNOSIS — O34219 Maternal care for unspecified type scar from previous cesarean delivery: Secondary | ICD-10-CM

## 2016-02-17 DIAGNOSIS — Z3A37 37 weeks gestation of pregnancy: Secondary | ICD-10-CM

## 2016-02-17 DIAGNOSIS — O34211 Maternal care for low transverse scar from previous cesarean delivery: Secondary | ICD-10-CM | POA: Diagnosis present

## 2016-02-17 DIAGNOSIS — O3413 Maternal care for benign tumor of corpus uteri, third trimester: Secondary | ICD-10-CM | POA: Diagnosis present

## 2016-02-17 DIAGNOSIS — O1002 Pre-existing essential hypertension complicating childbirth: Secondary | ICD-10-CM | POA: Diagnosis present

## 2016-02-17 DIAGNOSIS — Z8759 Personal history of other complications of pregnancy, childbirth and the puerperium: Secondary | ICD-10-CM

## 2016-02-17 DIAGNOSIS — Z98891 History of uterine scar from previous surgery: Secondary | ICD-10-CM

## 2016-02-17 DIAGNOSIS — D259 Leiomyoma of uterus, unspecified: Secondary | ICD-10-CM | POA: Diagnosis present

## 2016-02-17 DIAGNOSIS — O0993 Supervision of high risk pregnancy, unspecified, third trimester: Secondary | ICD-10-CM

## 2016-02-17 DIAGNOSIS — O10013 Pre-existing essential hypertension complicating pregnancy, third trimester: Secondary | ICD-10-CM

## 2016-02-17 DIAGNOSIS — O1092 Unspecified pre-existing hypertension complicating childbirth: Secondary | ICD-10-CM

## 2016-02-17 LAB — RPR: RPR Ser Ql: NONREACTIVE

## 2016-02-17 LAB — PREPARE RBC (CROSSMATCH)

## 2016-02-17 SURGERY — Surgical Case
Anesthesia: Spinal | Site: Abdomen

## 2016-02-17 MED ORDER — FENTANYL CITRATE (PF) 100 MCG/2ML IJ SOLN
INTRAMUSCULAR | Status: AC
  Filled 2016-02-17: qty 2

## 2016-02-17 MED ORDER — DIPHENHYDRAMINE HCL 25 MG PO CAPS: 25.0000 mg | ORAL_CAPSULE | Freq: Four times a day (QID) | ORAL | Status: DC | PRN

## 2016-02-17 MED ORDER — CEFAZOLIN SODIUM-DEXTROSE 2-4 GM/100ML-% IV SOLN
2.0000 g | INTRAVENOUS | Status: AC
  Administered 2016-02-17: 2 g via INTRAVENOUS
  Filled 2016-02-17 (×2): qty 100

## 2016-02-17 MED ORDER — SCOPOLAMINE 1 MG/3DAYS TD PT72
MEDICATED_PATCH | TRANSDERMAL | Status: AC
  Filled 2016-02-17: qty 1

## 2016-02-17 MED ORDER — PHENYLEPHRINE 8 MG IN D5W 100 ML (0.08MG/ML) PREMIX OPTIME
INJECTION | INTRAVENOUS | Status: DC | PRN
  Administered 2016-02-17: 60 ug/min via INTRAVENOUS

## 2016-02-17 MED ORDER — PHENYLEPHRINE 8 MG IN D5W 100 ML (0.08MG/ML) PREMIX OPTIME
INJECTION | INTRAVENOUS | Status: AC
  Filled 2016-02-17: qty 100

## 2016-02-17 MED ORDER — DIBUCAINE 1 % RE OINT: 1.0000 "application " | TOPICAL_OINTMENT | RECTAL | Status: DC | PRN

## 2016-02-17 MED ORDER — BUPIVACAINE HCL (PF) 0.5 % IJ SOLN
INTRAMUSCULAR | Status: DC | PRN
  Administered 2016-02-17: 30 mL

## 2016-02-17 MED ORDER — SCOPOLAMINE 1 MG/3DAYS TD PT72: 1.0000 | MEDICATED_PATCH | Freq: Once | TRANSDERMAL | Status: DC

## 2016-02-17 MED ORDER — IBUPROFEN 600 MG PO TABS
600.0000 mg | ORAL_TABLET | Freq: Four times a day (QID) | ORAL | Status: DC
  Administered 2016-02-17 – 2016-02-20 (×13): 600 mg via ORAL
  Filled 2016-02-17 (×14): qty 1

## 2016-02-17 MED ORDER — FENTANYL CITRATE (PF) 100 MCG/2ML IJ SOLN
INTRAMUSCULAR | Status: DC | PRN
  Administered 2016-02-17: 10 ug via INTRATHECAL

## 2016-02-17 MED ORDER — MORPHINE SULFATE (PF) 0.5 MG/ML IJ SOLN
INTRAMUSCULAR | Status: DC | PRN
  Administered 2016-02-17: .2 mg via INTRATHECAL

## 2016-02-17 MED ORDER — LACTATED RINGERS IV SOLN: 2.5000 [IU]/h | INTRAVENOUS | Status: AC

## 2016-02-17 MED ORDER — OXYTOCIN 10 UNIT/ML IJ SOLN
INTRAMUSCULAR | Status: AC
  Filled 2016-02-17: qty 4

## 2016-02-17 MED ORDER — NALBUPHINE HCL 10 MG/ML IJ SOLN: 5.0000 mg | Freq: Once | INTRAMUSCULAR | Status: DC | PRN

## 2016-02-17 MED ORDER — ZOLPIDEM TARTRATE 5 MG PO TABS: 5.0000 mg | ORAL_TABLET | Freq: Every evening | ORAL | Status: DC | PRN

## 2016-02-17 MED ORDER — KETOROLAC TROMETHAMINE 30 MG/ML IJ SOLN: 30.0000 mg | Freq: Four times a day (QID) | INTRAMUSCULAR | Status: AC | PRN

## 2016-02-17 MED ORDER — NALOXONE HCL 0.4 MG/ML IJ SOLN: 0.4000 mg | INTRAMUSCULAR | Status: DC | PRN

## 2016-02-17 MED ORDER — ACETAMINOPHEN 325 MG PO TABS
650.0000 mg | ORAL_TABLET | ORAL | Status: DC | PRN
  Administered 2016-02-18: 650 mg via ORAL
  Filled 2016-02-17: qty 2

## 2016-02-17 MED ORDER — DIPHENHYDRAMINE HCL 25 MG PO CAPS
25.0000 mg | ORAL_CAPSULE | ORAL | Status: DC | PRN
  Filled 2016-02-17: qty 1

## 2016-02-17 MED ORDER — ONDANSETRON HCL 4 MG/2ML IJ SOLN
4.0000 mg | Freq: Three times a day (TID) | INTRAMUSCULAR | Status: DC | PRN
Start: 2016-02-17 — End: 2016-02-20

## 2016-02-17 MED ORDER — SODIUM CHLORIDE 0.9% FLUSH: 3.0000 mL | INTRAVENOUS | Status: DC | PRN

## 2016-02-17 MED ORDER — ONDANSETRON HCL 4 MG/2ML IJ SOLN
INTRAMUSCULAR | Status: DC | PRN
  Administered 2016-02-17: 4 mg via INTRAVENOUS

## 2016-02-17 MED ORDER — SIMETHICONE 80 MG PO CHEW
80.0000 mg | CHEWABLE_TABLET | Freq: Three times a day (TID) | ORAL | Status: DC
  Administered 2016-02-17 – 2016-02-20 (×6): 80 mg via ORAL
  Filled 2016-02-17 (×6): qty 1

## 2016-02-17 MED ORDER — ONDANSETRON HCL 4 MG/2ML IJ SOLN
INTRAMUSCULAR | Status: AC
  Filled 2016-02-17: qty 2

## 2016-02-17 MED ORDER — MEPERIDINE HCL 25 MG/ML IJ SOLN
6.2500 mg | INTRAMUSCULAR | Status: DC | PRN
Start: 2016-02-17 — End: 2016-02-17

## 2016-02-17 MED ORDER — WITCH HAZEL-GLYCERIN EX PADS: 1.0000 "application " | MEDICATED_PAD | CUTANEOUS | Status: DC | PRN

## 2016-02-17 MED ORDER — MORPHINE SULFATE (PF) 0.5 MG/ML IJ SOLN
INTRAMUSCULAR | Status: AC
  Filled 2016-02-17: qty 10

## 2016-02-17 MED ORDER — DIPHENHYDRAMINE HCL 50 MG/ML IJ SOLN: 12.5000 mg | INTRAMUSCULAR | Status: DC | PRN

## 2016-02-17 MED ORDER — NALBUPHINE HCL 10 MG/ML IJ SOLN: 5.0000 mg | INTRAMUSCULAR | Status: DC | PRN

## 2016-02-17 MED ORDER — SIMETHICONE 80 MG PO CHEW
80.0000 mg | CHEWABLE_TABLET | ORAL | Status: DC
  Administered 2016-02-18 – 2016-02-20 (×3): 80 mg via ORAL
  Filled 2016-02-17 (×3): qty 1

## 2016-02-17 MED ORDER — LANOLIN HYDROUS EX OINT: 1.0000 "application " | TOPICAL_OINTMENT | CUTANEOUS | Status: DC | PRN

## 2016-02-17 MED ORDER — LACTATED RINGERS IV SOLN
INTRAVENOUS | Status: DC
  Administered 2016-02-17 (×3): via INTRAVENOUS

## 2016-02-17 MED ORDER — PRENATAL MULTIVITAMIN CH
1.0000 | ORAL_TABLET | Freq: Every day | ORAL | Status: DC
  Administered 2016-02-18 – 2016-02-20 (×3): 1 via ORAL
  Filled 2016-02-17 (×3): qty 1

## 2016-02-17 MED ORDER — NALOXONE HCL 2 MG/2ML IJ SOSY
1.0000 ug/kg/h | PREFILLED_SYRINGE | INTRAVENOUS | Status: DC | PRN
  Filled 2016-02-17: qty 2

## 2016-02-17 MED ORDER — OXYTOCIN 10 UNIT/ML IJ SOLN
40.0000 [IU] | INTRAVENOUS | Status: DC | PRN
  Administered 2016-02-17: 40 [IU] via INTRAVENOUS

## 2016-02-17 MED ORDER — SENNOSIDES-DOCUSATE SODIUM 8.6-50 MG PO TABS
2.0000 | ORAL_TABLET | ORAL | Status: DC
  Administered 2016-02-18: 2 via ORAL
  Filled 2016-02-17 (×2): qty 2

## 2016-02-17 MED ORDER — OXYCODONE HCL 5 MG PO TABS
10.0000 mg | ORAL_TABLET | ORAL | Status: DC | PRN
  Administered 2016-02-18 – 2016-02-19 (×2): 10 mg via ORAL
  Filled 2016-02-17 (×3): qty 2

## 2016-02-17 MED ORDER — CITRIC ACID-SODIUM CITRATE 334-500 MG/5ML PO SOLN
30.0000 mL | Freq: Once | ORAL | Status: AC
Start: 2016-02-17 — End: 2016-02-17
  Administered 2016-02-17: 30 mL via ORAL
  Filled 2016-02-17: qty 15

## 2016-02-17 MED ORDER — AMLODIPINE BESYLATE 5 MG PO TABS
5.0000 mg | ORAL_TABLET | Freq: Every day | ORAL | Status: DC
  Administered 2016-02-17 – 2016-02-20 (×4): 5 mg via ORAL
  Filled 2016-02-17 (×5): qty 1

## 2016-02-17 MED ORDER — KETOROLAC TROMETHAMINE 30 MG/ML IJ SOLN
INTRAMUSCULAR | Status: AC
  Filled 2016-02-17: qty 1

## 2016-02-17 MED ORDER — MENTHOL 3 MG MT LOZG: 1.0000 | LOZENGE | OROMUCOSAL | Status: DC | PRN

## 2016-02-17 MED ORDER — BUPIVACAINE IN DEXTROSE 0.75-8.25 % IT SOLN
INTRATHECAL | Status: DC | PRN
  Administered 2016-02-17: 1.6 mg via INTRATHECAL

## 2016-02-17 MED ORDER — SIMETHICONE 80 MG PO CHEW
80.0000 mg | CHEWABLE_TABLET | ORAL | Status: DC | PRN
  Administered 2016-02-18: 80 mg via ORAL
  Filled 2016-02-17: qty 1

## 2016-02-17 MED ORDER — SCOPOLAMINE 1 MG/3DAYS TD PT72
MEDICATED_PATCH | TRANSDERMAL | Status: DC | PRN
  Administered 2016-02-17: 1 via TRANSDERMAL

## 2016-02-17 MED ORDER — LACTATED RINGERS IV SOLN
INTRAVENOUS | Status: DC
  Administered 2016-02-17 – 2016-02-18 (×2): via INTRAVENOUS

## 2016-02-17 MED ORDER — OXYCODONE HCL 5 MG PO TABS
5.0000 mg | ORAL_TABLET | ORAL | Status: DC | PRN
  Administered 2016-02-18 – 2016-02-20 (×2): 5 mg via ORAL
  Filled 2016-02-17 (×2): qty 1

## 2016-02-17 MED ORDER — KETOROLAC TROMETHAMINE 30 MG/ML IJ SOLN
30.0000 mg | Freq: Four times a day (QID) | INTRAMUSCULAR | Status: AC | PRN
  Administered 2016-02-17: 30 mg via INTRAMUSCULAR

## 2016-02-17 MED ORDER — BUPIVACAINE HCL (PF) 0.5 % IJ SOLN
INTRAMUSCULAR | Status: AC
  Filled 2016-02-17: qty 30

## 2016-02-17 MED ORDER — TETANUS-DIPHTH-ACELL PERTUSSIS 5-2.5-18.5 LF-MCG/0.5 IM SUSP: 0.5000 mL | Freq: Once | INTRAMUSCULAR | Status: DC

## 2016-02-17 SURGICAL SUPPLY — 38 items
BARRIER ADHS 3X4 INTERCEED (GAUZE/BANDAGES/DRESSINGS) IMPLANT
BENZOIN TINCTURE PRP APPL 2/3 (GAUZE/BANDAGES/DRESSINGS) ×3 IMPLANT
CHLORAPREP W/TINT 26ML (MISCELLANEOUS) ×3 IMPLANT
CLAMP CORD UMBIL (MISCELLANEOUS) IMPLANT
CLOSURE WOUND 1/2 X4 (GAUZE/BANDAGES/DRESSINGS) ×1
CLOTH BEACON ORANGE TIMEOUT ST (SAFETY) ×3 IMPLANT
DRSG OPSITE POSTOP 4X10 (GAUZE/BANDAGES/DRESSINGS) ×3 IMPLANT
ELECT REM PT RETURN 9FT ADLT (ELECTROSURGICAL) ×3
ELECTRODE KNIFE SHAPED 22FR (ELECTROSURGICAL) ×3 IMPLANT
ELECTRODE REM PT RTRN 9FT ADLT (ELECTROSURGICAL) ×1 IMPLANT
EXTRACTOR VACUUM KIWI (MISCELLANEOUS) ×3 IMPLANT
GLOVE BIO SURGEON STRL SZ 6.5 (GLOVE) ×2 IMPLANT
GLOVE BIO SURGEONS STRL SZ 6.5 (GLOVE) ×1
GLOVE BIOGEL PI IND STRL 7.0 (GLOVE) ×2 IMPLANT
GLOVE BIOGEL PI INDICATOR 7.0 (GLOVE) ×4
GOWN STRL REUS W/TWL LRG LVL3 (GOWN DISPOSABLE) ×6 IMPLANT
KIT ABG SYR 3ML LUER SLIP (SYRINGE) IMPLANT
NEEDLE HYPO 22GX1.5 SAFETY (NEEDLE) IMPLANT
NEEDLE HYPO 25X5/8 SAFETYGLIDE (NEEDLE) IMPLANT
NS IRRIG 1000ML POUR BTL (IV SOLUTION) ×3 IMPLANT
PACK C SECTION WH (CUSTOM PROCEDURE TRAY) ×3 IMPLANT
PAD ABD 7.5X8 STRL (GAUZE/BANDAGES/DRESSINGS) ×3 IMPLANT
PAD OB MATERNITY 4.3X12.25 (PERSONAL CARE ITEMS) ×3 IMPLANT
PENCIL SMOKE EVAC W/HOLSTER (ELECTROSURGICAL) ×3 IMPLANT
RETRACTOR WND ALEXIS 25 LRG (MISCELLANEOUS) IMPLANT
RTRCTR WOUND ALEXIS 25CM LRG (MISCELLANEOUS)
SPONGE GAUZE 4X4 12PLY STER LF (GAUZE/BANDAGES/DRESSINGS) ×6 IMPLANT
SPONGE LAP 18X18 X RAY DECT (DISPOSABLE) ×6 IMPLANT
STERI STRIP BROWN 1/4X3 R155 (GAUZE/BANDAGES/DRESSINGS) ×3 IMPLANT
STRIP CLOSURE SKIN 1/2X4 (GAUZE/BANDAGES/DRESSINGS) ×2 IMPLANT
SUT VIC AB 0 CT1 36 (SUTURE) ×18 IMPLANT
SUT VIC AB 2-0 CT1 27 (SUTURE) ×2
SUT VIC AB 2-0 CT1 TAPERPNT 27 (SUTURE) ×1 IMPLANT
SUT VIC AB 4-0 KS 27 (SUTURE) ×3 IMPLANT
SUT VIC AB 4-0 PS2 27 (SUTURE) ×3 IMPLANT
SYR CONTROL 10ML LL (SYRINGE) IMPLANT
TOWEL OR 17X24 6PK STRL BLUE (TOWEL DISPOSABLE) ×3 IMPLANT
TRAY FOLEY CATH SILVER 14FR (SET/KITS/TRAYS/PACK) IMPLANT

## 2016-02-17 NOTE — Lactation Note (Signed)
This note was copied from a baby's chart. Lactation Consultation Note  Patient Name: Lindsey Dean [id number] Date: 02/17/2016 Reason for consult: Initial assessment Baby at 23 hr of life and mom reports bf is going well. She stated that she bf her older child for 6 yr. Her English is broken, no sure how much she understands but she declined interpreter.  Discussed LPT baby behavior, feeding frequency, baby belly size, voids, wt loss, breast changes, and nipple care. Demonstrated manual expression, colostrum noted bilaterally, spoon in room. Given lactation and LPT infant handouts. Aware of OP services and support group.      Maternal Data Has patient been taught Hand Expression?: Yes Does the patient have breastfeeding experience prior to this delivery?: Yes  Feeding Feeding Type: Breast Fed Length of feed: 30 min  LATCH Score/Interventions                      Lactation Tools Discussed/Used WIC Program: No   Consult Status Consult Status: Follow-up Date: 02/18/16 Follow-up type: In-patient    Denzil Hughes 02/17/2016, 8:33 PM

## 2016-02-17 NOTE — Op Note (Signed)
Cesarean Section Operative Report  Dimitra Malafronte  02/17/2016  Indications: history prior cesarean section, history myomectomy   Pre-operative Diagnosis:  REPEAT c-section  with prior myomectomy.   Post-operative Diagnosis: Same   Surgeon: Surgeon(s) and Role:    * Woodroe Mode, MD - Primary    * Gwynne Edinger, MD - Assisting   Attending Attestation: I was present and scrubbed for the entire procedure.   Anesthesia: spinal    Estimated Blood Loss: 800 ml  Total IV Fluids: 2600 ml LR  Urine Output:: 200 ml clear yellow urine  Specimens: none  Findings: Viable female infant in cephalic presentation; Apgars 8/9; weight 2785 g; arterial cord pH not obtained; clear amniotic fluid; intact placenta with three vessel cord; normal uterus, fallopian tubes and ovaries bilaterally. Fibroid uterus. No significant adhesive disease.  Baby condition / location:  Couplet care / Skin to Skin   Complications: no complications  Indications: Lindsey Dean is a 40 y.o. [id number] with an IUP [redacted]w[redacted]d presenting for scheduled repeat c/s. History prior c/s; history myomectomy the indication for c/s @ 37 wks.  The risks, benefits, complications, treatment options, and expected outcomes were discussed with the patient . The patient concurred with the proposed plan, giving informed consent. identified as Shian Mazzotta and the procedure verified as C-Section Delivery.  Procedure Details:  The patient was taken back to the operative suite where spinal anesthesia was placed.  A time out was held and the above information confirmed.   After induction of anesthesia, the patient was draped and prepped in the usual sterile manner and placed in a dorsal supine position with a leftward tilt. A Pfannenstiel incision was made and carried down through the subcutaneous tissue to the fascia. Fascial incision was made and sharply extended transversely. The fascia was separated from the underlying rectus tissue  superiorly and inferiorly. The peritoneum was identified and bluntlyy entered and extended longitudinally. Alexis retractor was placed. A low transverse uterine incision was made and extended bluntly. Bandage scissors used to extend right margin of incision cephalad. Delivered from cephalic presentation with vacuum-assistance (no pop-offs) was a viable infant with Apgars and weight as above. The umbilical cord was clamped and cut cord blood was obtained for evaluation. Cord ph was not sent. The placenta was removed Intact and appeared normal. The uterine incision was closed with running locked sutures of 0Vicryl with an imbricating layer of the same. Brisk bleeding at inferior margin of incision site hemostatic after placement of imbricating layer  Hemostasis was observed. The peritoneum was closed with 0 vicryl. The rectus muscles were examined and hemostasis observed. The fascia was then reapproximated with running sutures of 0Vicryl. A total of 30 ml 0.5 % Marcaine was injected subcutaneously at the margins of the incision. The subcuticular closure was performed using 2-0plain gut. The skin was closed with 4-0Vicryl.   Instrument, sponge, and needle counts were correct prior the abdominal closure and were correct at the conclusion of the case.     Disposition: PACU - hemodynamically stable.   Maternal Condition: stable       Signed: Ennis Forts 02/17/2016 9:37 AM

## 2016-02-17 NOTE — Anesthesia Postprocedure Evaluation (Signed)
Anesthesia Post Note  Patient: Lindsey Dean  Procedure(s) Performed: Procedure(s) (LRB): REPEAT CESAREAN SECTION (N/A)  Patient location during evaluation: PACU Anesthesia Type: Spinal and MAC Level of consciousness: awake and alert Pain management: pain level controlled Vital Signs Assessment: post-procedure vital signs reviewed and stable Respiratory status: spontaneous breathing and respiratory function stable Cardiovascular status: blood pressure returned to baseline and stable Postop Assessment: spinal receding Anesthetic complications: no    Last Vitals:  Filed Vitals:   02/17/16 1047 02/17/16 1105  BP:  120/65  Pulse: 77 65  Temp:  36.7 C  Resp: 19 18    Last Pain:  Filed Vitals:   02/17/16 1205  PainSc: 1                  Tiajuana Amass

## 2016-02-17 NOTE — Anesthesia Preprocedure Evaluation (Addendum)
Anesthesia Evaluation  Patient identified by MRN, date of birth, ID band Patient awake    Reviewed: Allergy & Precautions, NPO status , Patient's Chart, lab work & pertinent test results  Airway Mallampati: III  TM Distance: >3 FB Neck ROM: Full    Dental   Pulmonary neg pulmonary ROS,    Pulmonary exam normal        Cardiovascular hypertension, Pt. on medications Normal cardiovascular exam     Neuro/Psych negative neurological ROS     GI/Hepatic negative GI ROS, Neg liver ROS,   Endo/Other  negative endocrine ROS  Renal/GU negative Renal ROS     Musculoskeletal   Abdominal   Peds  Hematology negative hematology ROS (+)   Anesthesia Other Findings   Reproductive/Obstetrics (+) Pregnancy (IUP for repeat C/S)                            Lab Results  Component Value Date   WBC 4.7 02/16/2016   HGB 10.0* 02/16/2016   HCT 31.3* 02/16/2016   MCV 77.5* 02/16/2016   PLT 239 02/16/2016   Lab Results  Component Value Date   CREATININE 0.62 02/16/2016   BUN 8 02/16/2016   NA 137 02/16/2016   K 3.8 02/16/2016   CL 107 02/16/2016   CO2 21* 02/16/2016    Anesthesia Physical Anesthesia Plan  ASA: III  Anesthesia Plan: Spinal   Post-op Pain Management:    Induction:   Airway Management Planned: Natural Airway  Additional Equipment:   Intra-op Plan:   Post-operative Plan:   Informed Consent: I have reviewed the patients History and Physical, chart, labs and discussed the procedure including the risks, benefits and alternatives for the proposed anesthesia with the patient or authorized representative who has indicated his/her understanding and acceptance.     Plan Discussed with: CRNA  Anesthesia Plan Comments:        Anesthesia Quick Evaluation

## 2016-02-17 NOTE — Anesthesia Procedure Notes (Signed)
Spinal Patient location during procedure: OR Staffing Anesthesiologist: Suzette Battiest Performed by: anesthesiologist  Preanesthetic Checklist Completed: patient identified, site marked, surgical consent, pre-op evaluation, timeout performed, IV checked, risks and benefits discussed and monitors and equipment checked Spinal Block Patient position: sitting Prep: site prepped and draped and DuraPrep Patient monitoring: heart rate, continuous pulse ox and blood pressure Approach: midline Location: L3-4 Needle Needle type: Pencan  Needle gauge: 24 G Needle length: 9 cm Assessment Sensory level: T4

## 2016-02-17 NOTE — H&P (Signed)
LABOR AND DELIVERY ADMISSION HISTORY AND PHYSICAL NOTE  Lindsey Dean is a 40 y.o. female G2P1001 with IUP at [redacted]w[redacted]d by L/14 presenting for repeat c/s. Hx myomectomy and c/s x1.   She reports positive fetal movement. She denies leakage of fluid or vaginal bleeding.  Prenatal History/Complications:  Past Medical History: Past Medical History  Diagnosis Date  . Hypertension     labetolol 200 bid    Past Surgical History: Past Surgical History  Procedure Laterality Date  . Cesarean section    . Myomectomy abdominal approach  [place] 2006    Obstetrical History: OB History    Gravida Para Term Preterm AB TAB SAB Ectopic Multiple Living   2 1 1       1       Social History: Social History   Social History  . Marital Status: Married    Spouse Name: N/A  . Number of Children: N/A  . Years of Education: N/A   Social History Main Topics  . Smoking status: Never Smoker   . Smokeless tobacco: Never Used  . Alcohol Use: No  . Drug Use: No  . Sexual Activity: Yes   Other Topics Concern  . None   Social History Narrative    Family History: History reviewed. No pertinent family history.  Allergies: No Known Allergies  Prescriptions prior to admission  Medication Sig Dispense Refill Last Dose  . aspirin EC 81 MG tablet Take 1 tablet (81 mg total) by mouth daily. 30 tablet 6 02/11/2016 at Unknown time  . labetalol (NORMODYNE) 200 MG tablet Take 1 tablet (200 mg total) by mouth 2 (two) times daily. 60 tablet 2 02/12/2016 at Unknown time  . omeprazole (PRILOSEC OTC) 20 MG tablet Take 1 tablet (20 mg total) by mouth daily. 30 tablet 3 Past Week at Unknown time  . Prenatal Vit-Fe Fumarate-FA (PRENATAL VITAMIN PO) Take by mouth.   02/12/2016 at Unknown time     Review of Systems   All systems reviewed and negative except as stated in HPI  Height 5' 2.6" (1.59 m), weight 197 lb (89.359 kg), last menstrual period 06/03/2015. General appearance: alert, cooperative and appears  stated age Lungs: clear to auscultation bilaterally Heart: regular rate and rhythm Abdomen: soft, non-tender; bowel sounds normal Extremities: No calf swelling or tenderness      Prenatal labs: ABO, Rh: --/--/O POS (04/07 0915) Antibody: NEG (04/07 0915) Rubella: !Error!imm RPR: Non Reactive (04/07 0915)  HBsAg: Negative (11/10 0000)  HIV: NONREACTIVE (02/06 1352)  GBS: Negative (04/03 0000)  1 hr Glucola: 115 Genetic screening:  Quad neg Anatomy US: wnl  Prenatal Transfer Tool  Maternal Diabetes: No Genetic Screening: Normal Maternal Ultrasounds/Referrals: Normal Fetal Ultrasounds or other Referrals:  None Maternal Substance Abuse:  No Significant Maternal Medications:  labetalol Significant Maternal Lab Results: Lab values include: Group B Strep negative  Results for orders placed or performed during the hospital encounter of 02/17/16 (from the past 24 hour(s))  Prepare RBC (crossmatch)   Collection Time: 02/17/16  9:07 AM  Result Value Ref Range   Order Confirmation ORDER PROCESSED BY BLOOD BANK   Results for orders placed or performed during the hospital encounter of 02/17/16 (from the past 24 hour(s))  CBC   Collection Time: 02/16/16  9:15 AM  Result Value Ref Range   WBC 4.7 4.0 - 10.5 K/uL   RBC 4.04 3.87 - 5.11 MIL/uL   Hemoglobin 10.0 (L) 12.0 - 15.0 g/dL   HCT 31.3 (L) 36.0 -  46.0 %   MCV 77.5 (L) 78.0 - 100.0 fL   MCH 24.8 (L) 26.0 - 34.0 pg   MCHC 31.9 30.0 - 36.0 g/dL   RDW 14.5 11.5 - 15.5 %   Platelets 239 150 - 400 K/uL  RPR   Collection Time: 02/16/16  9:15 AM  Result Value Ref Range   RPR Ser Ql Non Reactive Non Reactive  Basic metabolic panel   Collection Time: 02/16/16  9:15 AM  Result Value Ref Range   Sodium 137 135 - 145 mmol/L   Potassium 3.8 3.5 - 5.1 mmol/L   Chloride 107 101 - 111 mmol/L   CO2 21 (L) 22 - 32 mmol/L   Glucose, Bld 102 (H) 65 - 99 mg/dL   BUN 8 6 - 20 mg/dL   Creatinine, Ser 0.62 0.44 - 1.00 mg/dL   Calcium 9.0  8.9 - 10.3 mg/dL   GFR calc non Af Amer >60 >60 mL/min   GFR calc Af Amer >60 >60 mL/min   Anion gap 9 5 - 15  Type and screen   Collection Time: 02/16/16  9:15 AM  Result Value Ref Range   ABO/RH(D) O POS    Antibody Screen NEG    Sample Expiration 02/19/2016    Unit Number [id number]    Blood Component Type RED CELLS,LR    Unit division 00    Status of Unit ALLOCATED    Transfusion Status OK TO TRANSFUSE    Crossmatch Result Compatible    Unit Number [id number]    Blood Component Type RED CELLS,LR    Unit division 00    Status of Unit ALLOCATED    Transfusion Status OK TO TRANSFUSE    Crossmatch Result Compatible     Patient Active Problem List   Diagnosis Date Noted  . Essential hypertension antepartum 10/09/2015  . Essential hypertension 10/09/2015  . History of pregnancy induced hypertension 10/09/2015  . Supervision of high risk pregnancy, antepartum 10/09/2015  . Female circumcision 10/09/2015  . Advanced maternal age in multigravida 09/11/2015  . Fibroid, uterine 09/11/2015  . Previous cesarean delivery, antepartum 09/11/2015    Assessment: Lindsey Dean is a 40 y.o. G2P1001 at [redacted]w[redacted]d here for rltcs. Hx c/s x1; performing this c/s @ 37 weeks given hx myomectomy.  #Labor: c/s. The risks of cesarean section were discussed with the patient including but were not limited to: bleeding which may require transfusion or reoperation; infection which may require antibiotics; injury to bowel, bladder, ureters or other surrounding organs; injury to the fetus; need for additional procedures including hysterectomy in the event of a life-threatening hemorrhage; placental abnormalities wth subsequent pregnancies, incisional problems, thromboembolic phenomenon and other postoperative/anesthesia complications.  H 10. Platelets wnl. 2 g ancef. #Pain: spinal #ID:  gbs neg #MOF: br #MOC: condoms #CHTN: admit bp pending. No preE symptoms  Desma Maxim 02/17/2016, 8:01 AM

## 2016-02-17 NOTE — Transfer of Care (Signed)
Immediate Anesthesia Transfer of Care Note  Patient: Lindsey Dean  Procedure(s) Performed: Procedure(s): REPEAT CESAREAN SECTION (N/A)  Patient Location: PACU  Anesthesia Type:Spinal  Level of Consciousness: awake, alert  and oriented  Airway & Oxygen Therapy: Patient Spontanous Breathing  Post-op Assessment: Report given to RN  Post vital signs: Reviewed  Last Vitals: There were no vitals filed for this visit.  Complications: No apparent anesthesia complications

## 2016-02-18 LAB — CBC
HEMATOCRIT: 26.9 % — AB (ref 36.0–46.0)
HEMOGLOBIN: 8.6 g/dL — AB (ref 12.0–15.0)
MCH: 25.1 pg — ABNORMAL LOW (ref 26.0–34.0)
MCHC: 32 g/dL (ref 30.0–36.0)
MCV: 78.4 fL (ref 78.0–100.0)
Platelets: 221 10*3/uL (ref 150–400)
RBC: 3.43 MIL/uL — AB (ref 3.87–5.11)
RDW: 14.7 % (ref 11.5–15.5)
WBC: 6 10*3/uL (ref 4.0–10.5)

## 2016-02-18 LAB — BIRTH TISSUE RECOVERY COLLECTION (PLACENTA DONATION)

## 2016-02-18 MED ORDER — FERROUS SULFATE 325 (65 FE) MG PO TABS
325.0000 mg | ORAL_TABLET | Freq: Two times a day (BID) | ORAL | Status: DC
  Administered 2016-02-18 – 2016-02-20 (×5): 325 mg via ORAL
  Filled 2016-02-18 (×5): qty 1

## 2016-02-18 NOTE — Progress Notes (Signed)
POSTPARTUM PROGRESS NOTE  Post Partum Day 1 Subjective:  Lindsey Dean is a 40 y.o. [id number] [redacted]w[redacted]d s/p rltcs. No ha, vision change, ruq pain, or sob.  No acute events overnight.  Pt denies problems with ambulating, voiding or po intake.  She reports nausea or vomiting.  Pain is well controlled.  She has had flatus. She has not had bowel movement.  Lochia Small.   Objective: Blood pressure 134/70, pulse 71, temperature 98.2 F (36.8 C), temperature source Oral, resp. rate 18, height 5' 2.6" (1.59 m), weight 197 lb (89.359 kg), last menstrual period 06/03/2015, SpO2 100 %, unknown if currently breastfeeding.  Physical Exam:  General: alert, cooperative and no distress Lochia:normal flow Chest: CTAB Heart: RRR no m/r/g Abdomen: +BS, soft, nontender, pressure dressing in place Uterine Fundus: firm,  DVT Evaluation: No calf swelling or tenderness Extremities: trace edema   Recent Labs  02/16/16 0915 02/18/16 0556  HGB 10.0* 8.6*  HCT 31.3* 26.9*    Assessment/Plan:  ASSESSMENT: Lindsey Dean is a 40 y.o. [id number] [redacted]w[redacted]d s/p rltcs, doing well. Starting iron for H 8.6. Re-starting pre-preg amlodipine. Plan for d/c tomorrow.   LOS: 1 day   Desma Maxim 02/18/2016, 8:22 AM

## 2016-02-18 NOTE — Addendum Note (Signed)
Addendum  created 02/18/16 1512 by Ignacia Bayley, CRNA   Modules edited: Clinical Notes   Clinical Notes:  File: [id number]

## 2016-02-18 NOTE — Progress Notes (Signed)
Post OP Day 1 Subjective:  Lindsey Dean is a 40 y.o. G2P2002 [redacted]w[redacted]d s/p rLTCS.  No acute events overnight. Attempted to see this patient twice this am but unfortunately, she was not present in room either time.  Plan for birth control is condoms.  Method of Feeding: Breast  Objective: Blood pressure 134/70, pulse 71, temperature 98.2 F (36.8 C), temperature source Oral, resp. rate 18, height 5' 2.6" (1.59 m), weight 197 lb (89.359 kg), last menstrual period 06/03/2015, SpO2 100 %, unknown if currently breastfeeding.  Physical Exam:  Not in room, per RN likely in NICU seeing baby.  Will ask day team to see this afternoon.   Recent Labs  02/16/16 0915 02/18/16 0556  HGB 10.0* 8.6*  HCT 31.3* 26.9*    Assessment/Plan:  ASSESSMENT: Lindsey Dean is a 40 y.o. [id number] [redacted]w[redacted]d s/p rLTCS  Plan for discharge tomorrow and Breastfeeding   LOS: 1 day   [employer], DO 02/18/2016, 7:06 AM

## 2016-02-18 NOTE — Anesthesia Postprocedure Evaluation (Signed)
Anesthesia Post Note  Patient: Lindsey Dean  Procedure(s) Performed: Procedure(s) (LRB): REPEAT CESAREAN SECTION (N/A)  Patient location during evaluation: Mother Baby Anesthesia Type: Spinal Level of consciousness: awake Pain management: pain level controlled Vital Signs Assessment: post-procedure vital signs reviewed and stable Respiratory status: spontaneous breathing Cardiovascular status: stable Postop Assessment: no headache, no backache, spinal receding, patient able to bend at knees, no signs of nausea or vomiting and adequate PO intake Anesthetic complications: no    Last Vitals:  Filed Vitals:   02/18/16 0500 02/18/16 0900  BP: 134/70 129/73  Pulse: 71 77  Temp: 36.8 C 36.9 C  Resp: 18 16    Last Pain:  Filed Vitals:   02/18/16 1217  PainSc: 5                  [name]

## 2016-02-19 MED ORDER — IBUPROFEN 600 MG PO TABS: 600.0000 mg | ORAL_TABLET | Freq: Four times a day (QID) | ORAL | Status: AC

## 2016-02-19 MED ORDER — FERROUS SULFATE 325 (65 FE) MG PO TABS: 325.0000 mg | ORAL_TABLET | Freq: Two times a day (BID) | ORAL | Status: AC

## 2016-02-19 NOTE — Progress Notes (Signed)
Subjective: Postpartum Day 2: Cesarean Delivery Patient reports incisional pain, tolerating PO, + flatus, + BM and no problems voiding.    Objective: Vital signs in last 24 hours: Temp:  [98.3 F (36.8 C)-98.5 F (36.9 C)] 98.4 F (36.9 C) (04/10 0546) Pulse Rate:  [77-95] 86 (04/10 0546) Resp:  [16-18] 18 (04/10 0546) BP: (129-147)/(73-92) 140/92 mmHg (04/10 0546) SpO2:  [100 %] 100 % (04/10 0546)  Physical Exam:  General: alert, cooperative, appears stated age and no distress Lochia: appropriate Uterine Fundus: firm Incision: healing well, no significant drainage, no dehiscence, no significant erythema DVT Evaluation: No evidence of DVT seen on physical exam. Negative Homan's sign. No cords or calf tenderness.   Recent Labs  02/16/16 0915 02/18/16 0556  HGB 10.0* 8.6*  HCT 31.3* 26.9*    Assessment/Plan: Status post Cesarean section. Doing well postoperatively.  Continue current care.  LAWSON, MARIE DARLENE 02/19/2016, 6:18 AM

## 2016-02-19 NOTE — Progress Notes (Signed)
CSW acknowledges NICU admission.    Patient screened out for psychosocial assessment since none of the following apply:  Psychosocial stressors documented in mother or baby's chart  Gestation less than 32 weeks  Code at delivery   Infant with anomalies  Please contact the Clinical Social Worker if specific needs arise, or by MOB's request.       

## 2016-02-19 NOTE — Discharge Summary (Signed)
OB Discharge Summary     Patient Name: Lindsey Dean DOB: 03/12/1976 MRN: [id number]  Date of admission: 02/17/2016 Delivering MD: Lindsey Dean   Date of discharge: 02/20/2016  Admitting diagnosis: 63WKS INDUCTION   REPEAT c-section  x's 2 with prior myomectomy Intrauterine pregnancy: [redacted]w[redacted]d     Secondary diagnosis:  Active Problems:   Previous cesarean delivery, antepartum   Status post repeat low transverse cesarean section  Additional problems: none     Discharge diagnosis: Term Pregnancy Delivered and CHTN                                                                                                Post partum procedures:none  Augmentation: none  Complications: None  Hospital course:  Sceduled C/S   40 y.o. yo G2P2002 at [redacted]w[redacted]d was admitted to the hospital 02/17/2016 for scheduled cesarean section with the following indication:Elective Repeat.  Membrane Rupture Time/Date: 8:58 AM ,02/17/2016   Patient delivered a Viable infant.02/17/2016  Details of operation can be found in separate operative note.  Pateint had an uncomplicated postpartum course.  She is ambulating, tolerating a regular diet, passing flatus, and urinating well. Patient is discharged home in stable condition on  02/20/2016        Physical exam  Filed Vitals:   02/19/16 1211 02/19/16 1750 02/19/16 1925 02/20/16 0507  BP: 127/97 154/82 140/75 139/76  Pulse: 88 84 83 81  Temp: 98.2 F (36.8 C) 98.9 F (37.2 C)  98.2 F (36.8 C)  TempSrc: Oral Oral  Oral  Resp: 18 20  19   Height:      Weight:      SpO2:       General: alert, cooperative and no distress Lochia: appropriate Uterine Fundus: firm Incision: Healing well with no significant drainage, No significant erythema, Dressing is clean, dry, and intact DVT Evaluation: No evidence of DVT seen on physical exam. Negative Homan's sign. No cords or calf tenderness. No significant calf/ankle edema. Labs: Lab Results  Component Value Date   WBC 6.0  02/18/2016   HGB 8.6* 02/18/2016   HCT 26.9* 02/18/2016   MCV 78.4 02/18/2016   PLT 221 02/18/2016   CMP Latest Ref Rng 02/16/2016  Glucose 65 - 99 mg/dL 102(H)  BUN 6 - 20 mg/dL 8  Creatinine 0.44 - 1.00 mg/dL 0.62  Sodium 135 - 145 mmol/L 137  Potassium 3.5 - 5.1 mmol/L 3.8  Chloride 101 - 111 mmol/L 107  CO2 22 - 32 mmol/L 21(L)  Calcium 8.9 - 10.3 mg/dL 9.0  Total Protein 6.1 - 8.1 g/dL -  Total Bilirubin 0.2 - 1.2 mg/dL -  Alkaline Phos 33 - 115 U/L -  AST 10 - 30 U/L -  ALT 6 - 29 U/L -    Discharge instruction: per After Visit Summary and "Baby and Me Booklet".  After visit meds:    Medication List    STOP taking these medications        aspirin EC 81 MG tablet     labetalol 200 MG tablet  Commonly known as:  NORMODYNE  TAKE these medications        amLODipine 5 MG tablet  Commonly known as:  NORVASC  Take 1 tablet (5 mg total) by mouth daily.     ferrous sulfate 325 (65 FE) MG tablet  Take 1 tablet (325 mg total) by mouth 2 (two) times daily with a meal.     ibuprofen 600 MG tablet  Commonly known as:  ADVIL,MOTRIN  Take 1 tablet (600 mg total) by mouth every 6 (six) hours.     omeprazole 20 MG tablet  Commonly known as:  PRILOSEC OTC  Take 1 tablet (20 mg total) by mouth daily.     oxyCODONE 5 MG immediate release tablet  Commonly known as:  Oxy IR/ROXICODONE  Take 1 tablet (5 mg total) by mouth every 4 (four) hours as needed (pain scale 4-7).     PRENATAL VITAMIN PO  Take 1 tablet by mouth daily.        Diet: low salt diet  Activity: Advance as tolerated. Pelvic rest for 6 weeks.   Outpatient follow up:6 weeks Follow up Appt:No future appointments. Follow up Visit:No Follow-up on file.  Postpartum contraception: Nexplanon  Newborn Data: Live born female  Birth Weight: 6 lb 2.2 oz (2785 g) APGAR: 8, 9  Baby Feeding: Breast Disposition:NICU   02/20/2016 Lindsey Doss, DO   OB fellow attestation I have seen and examined  this patient and agree with above documentation in the resident's note.   Lindsey Dean is a 40 y.o. [id number] s/p rLTCS.   Pain is well controlled.  Plan for birth control is Nexplanon.  Method of Feeding: Breast  PE:  BP 139/76 mmHg  Pulse 81  Temp(Src) 98.2 F (36.8 C) (Oral)  Resp 19  Ht 5' 2.6" (1.59 m)  Wt 197 lb (89.359 kg)  BMI 35.35 kg/m2  SpO2 100%  LMP 06/03/2015 (Exact Date)  Breastfeeding? Unknown Gen: well appearing Heart: reg rate Lungs: normal WOB Fundus firm Ext: soft, no pain, no edema   Recent Labs  02/18/16 0556  HGB 8.6*  HCT 26.9*   Plan: discharge today - postpartum care discussed - f/u clinic in 6 weeks for postpartum visit  Lindsey Macadam, MD 10:50 AM  Supervising physician: Lindsey Boston MD

## 2016-02-19 NOTE — Lactation Note (Signed)
This note was copied from a baby's chart. Lactation Consultation Note  Breastfeeding consultation services and support information given and reviewed.  Providing Breastmilk For Your Baby In NICU also provided for patient.  Mom states she does not want to pump and goes to the NICU each feeding.  She states baby breastfeeds well.  Breasts filling.  Mom is hoping to take baby home with her tomorrow.  Patient Name: Lindsey Dean [id number] Date: 02/19/2016     Maternal Data    Feeding Feeding Type: Breast Fed Length of feed: 30 min (15 min each breast)  LATCH Score/Interventions Latch: Grasps breast easily, tongue down, lips flanged, rhythmical sucking.  Audible Swallowing: Spontaneous and intermittent  Type of Nipple: Everted at rest and after stimulation  Comfort (Breast/Nipple): Soft / non-tender     Hold (Positioning): No assistance needed to correctly position infant at breast.  LATCH Score: 10  Lactation Tools Discussed/Used     Consult Status      Ave Filter 02/19/2016, 1:52 PM

## 2016-02-19 NOTE — Progress Notes (Signed)
UR chart review completed.  

## 2016-02-20 LAB — TYPE AND SCREEN
ABO/RH(D): O POS
ANTIBODY SCREEN: NEGATIVE
UNIT DIVISION: 0
Unit division: 0

## 2016-02-20 MED ORDER — OXYCODONE HCL 5 MG PO TABS: 5.0000 mg | ORAL_TABLET | ORAL | Status: AC | PRN

## 2016-02-20 MED ORDER — AMLODIPINE BESYLATE 5 MG PO TABS: 5.0000 mg | ORAL_TABLET | Freq: Every day | ORAL | Status: DC

## 2016-02-20 NOTE — Discharge Instructions (Signed)
Nothing per vagina for the next 6 weeks. No intercourse, no tampons, no douching.  You may shower but avoid bathing.  Your dressing should fall off in the next 3 days.  If it becomes very saturated or does not fall off, you may manually remove it.  Postpartum Care After Cesarean Delivery After you deliver your newborn (postpartum period), the usual stay in the hospital is 24-72 hours. If there were problems with your labor or delivery, or if you have other medical problems, you might be in the hospital longer.  While you are in the hospital, you will receive help and instructions on how to care for yourself and your newborn during the postpartum period.  While you are in the hospital:  It is normal for you to have pain or discomfort from the incision in your abdomen. Be sure to tell your nurses when you are having pain, where the pain is located, and what makes the pain worse.  If you are breastfeeding, you may feel uncomfortable contractions of your uterus for a couple of weeks. This is normal. The contractions help your uterus get back to normal size.  It is normal to have some bleeding after delivery.  For the first 1-3 days after delivery, the flow is red and the amount may be similar to a period.  It is common for the flow to start and stop.  In the first few days, you may pass some small clots. Let your nurses know if you begin to pass large clots or your flow increases.  Do not  flush blood clots down the toilet before having the nurse look at them.  During the next 3-10 days after delivery, your flow should become more watery and pink or brown-tinged in color.  Ten to fourteen days after delivery, your flow should be a small amount of yellowish-white discharge.  The amount of your flow will decrease over the first few weeks after delivery. Your flow may stop in 6-8 weeks. Most women have had their flow stop by 12 weeks after delivery.  You should change your sanitary pads  frequently.  Wash your hands thoroughly with soap and water for at least 20 seconds after changing pads, using the toilet, or before holding or feeding your newborn.  Your intravenous (IV) tubing will be removed when you are drinking enough fluids.  The urine drainage tube (urinary catheter) that was inserted before delivery may be removed within 6-8 hours after delivery or when feeling returns to your legs. You should feel like you need to empty your bladder within the first 6-8 hours after the catheter has been removed.  In case you become weak, lightheaded, or faint, call your nurse before you get out of bed for the first time and before you take a shower for the first time.  Within the first few days after delivery, your breasts may begin to feel tender and full. This is called engorgement. Breast tenderness usually goes away within 48-72 hours after engorgement occurs. You may also notice milk leaking from your breasts. If you are not breastfeeding, do not stimulate your breasts. Breast stimulation can make your breasts produce more milk.  Spending as much time as possible with your newborn is very important. During this time, you and your newborn can feel close and get to know each other. Having your newborn stay in your room (rooming in) will help to strengthen the bond with your newborn. It will give you time to get to know your  newborn and become comfortable caring for your newborn.  Your hormones change after delivery. Sometimes the hormone changes can temporarily cause you to feel sad or tearful. These feelings should not last more than a few days. If these feelings last longer than that, you should talk to your caregiver.  If desired, talk to your caregiver about methods of family planning or contraception.  Talk to your caregiver about immunizations. Your caregiver may want you to have the following immunizations before leaving the hospital:  Tetanus, diphtheria, and pertussis (Tdap)  or tetanus and diphtheria (Td) immunization. It is very important that you and your family (including grandparents) or others caring for your newborn are up-to-date with the Tdap or Td immunizations. The Tdap or Td immunization can help protect your newborn from getting ill.  Rubella immunization.  Varicella (chickenpox) immunization.  Influenza immunization. You should receive this annual immunization if you did not receive the immunization during your pregnancy.   This information is not intended to replace advice given to you by your health care provider. Make sure you discuss any questions you have with your health care provider.   Document Released: 07/22/2012 Document Reviewed: 07/22/2012 Elsevier Interactive Patient Education [employer].

## 2016-02-20 NOTE — Progress Notes (Signed)
Patient discharged home with family... Pt refused an interpreter for discharge instructions... Discharge instructions reviewed with patient and she verbalized understanding... Condition stable... No equipment... Patient will visit with baby in NICU and discharge to home from there.

## 2016-02-21 ENCOUNTER — Ambulatory Visit: Payer: Self-pay

## 2016-02-21 ENCOUNTER — Encounter (HOSPITAL_COMMUNITY): Payer: Self-pay | Admitting: Obstetrics & Gynecology

## 2016-02-21 NOTE — Lactation Note (Signed)
This note was copied from a baby's chart. Lactation Consultation Note  Met with mom in the NICU.  She had just finished breastfeeding baby.  She states she pumped at home at brought milk in to baby.  Denies breast discomfort.  Encouraged to call with concerns.  Patient Name: Boy Amybeth Sieg LDJTT'S Date: 02/21/2016     Maternal Data    Feeding Feeding Type: Bottle Fed - Breast Milk Nipple Type: Slow - flow Length of feed: 20 min  LATCH Score/Interventions Latch: Grasps breast easily, tongue down, lips flanged, rhythmical sucking. Intervention(s): Adjust position;Assist with latch  Audible Swallowing: Spontaneous and intermittent Intervention(s): Skin to skin  Type of Nipple: Everted at rest and after stimulation  Comfort (Breast/Nipple): Soft / non-tender     Hold (Positioning): No assistance needed to correctly position infant at breast. Intervention(s): Breastfeeding basics reviewed  LATCH Score: 10  Lactation Tools Discussed/Used     Consult Status      Ave Filter 02/21/2016, 12:27 PM

## 2016-03-06 ENCOUNTER — Ambulatory Visit: Payer: Medicaid Other

## 2016-03-06 VITALS — BP 167/97 | HR 74 | Wt 182.9 lb

## 2016-03-06 DIAGNOSIS — IMO0001 Reserved for inherently not codable concepts without codable children: Secondary | ICD-10-CM

## 2016-03-06 DIAGNOSIS — R030 Elevated blood-pressure reading, without diagnosis of hypertension: Principal | ICD-10-CM

## 2016-03-06 LAB — POCT URINALYSIS DIP (DEVICE)
Bilirubin Urine: NEGATIVE
GLUCOSE, UA: NEGATIVE mg/dL
KETONES UR: NEGATIVE mg/dL
Leukocytes, UA: NEGATIVE
Nitrite: NEGATIVE
PH: 5 (ref 5.0–8.0)
PROTEIN: NEGATIVE mg/dL
SPECIFIC GRAVITY, URINE: 1.01 (ref 1.005–1.030)
UROBILINOGEN UA: 0.2 mg/dL (ref 0.0–1.0)

## 2016-03-06 MED ORDER — AMLODIPINE BESYLATE 10 MG PO TABS: 10.0000 mg | ORAL_TABLET | Freq: Every day | ORAL | Status: AC

## 2016-03-06 NOTE — Progress Notes (Signed)
Patient ID: Lindsey Dean, female   DOB: May 21, 1976, 40 y.o.   MRN: [id number] Pt is being seen for BP check upon arrival her BP was 173/99 after waiting 68min I rechecked the patients BP and it was 167/97. Pt reports having "headaches at night" rating them about 7-8 on a scale of 0-10 pt takes tylenol and reports pain after several hours to be 4-5 on scale 0-10.  Spoke with Yvonne Kendall about the patients BP's and she asked me to get a urine, the urine shows no PRO. Cecille Rubin Clemmons stated "increase amlodipine to 10mg /day. Call or report to [facility] if pt has unrelieved headache or starts to see spots. Pt is to report back on Monday

## 2016-03-11 ENCOUNTER — Ambulatory Visit: Payer: Medicaid Other

## 2016-03-11 VITALS — BP 165/98 | HR 82

## 2016-03-11 DIAGNOSIS — IMO0001 Reserved for inherently not codable concepts without codable children: Secondary | ICD-10-CM

## 2016-03-11 DIAGNOSIS — R030 Elevated blood-pressure reading, without diagnosis of hypertension: Principal | ICD-10-CM

## 2016-03-11 NOTE — Progress Notes (Signed)
Pt comes into clinic today for blood pressure check. BP is 165/98 consulted with Dr.Constant who stated she should continued to take her medicine and monitored blood pressure at home. Blood pressure will be address at her postpartum visit in the next couple weeks.

## 2016-04-05 ENCOUNTER — Ambulatory Visit (INDEPENDENT_AMBULATORY_CARE_PROVIDER_SITE_OTHER): Payer: Medicaid Other | Admitting: Obstetrics and Gynecology

## 2016-04-05 ENCOUNTER — Encounter: Payer: Self-pay | Admitting: Obstetrics and Gynecology

## 2016-04-05 DIAGNOSIS — Z30017 Encounter for initial prescription of implantable subdermal contraceptive: Secondary | ICD-10-CM | POA: Diagnosis not present

## 2016-04-05 LAB — POCT PREGNANCY, URINE: PREG TEST UR: NEGATIVE

## 2016-04-05 MED ORDER — ETONOGESTREL 68 MG ~~LOC~~ IMPL: 68.0000 mg | DRUG_IMPLANT | Freq: Once | SUBCUTANEOUS | Status: AC

## 2016-04-05 NOTE — Progress Notes (Signed)
  Subjective:     Lindsey Dean is a 40 y.o. female who presents for a postpartum visit. She is 7 weeks postpartum following a low cervical transverse Cesarean section. I have fully reviewed the prenatal and intrapartum course. The delivery was at 65 gestational weeks secondary to previous myomectomy. Outcome: repeat cesarean section, low transverse incision. Anesthesia: spinal. Postpartum course has been uncomplicated. Baby's course has been uncomplicated. Baby is feeding by breast. Bleeding staining only. Bowel function is normal. Bladder function is normal. Patient is not sexually active. Contraception method is abstinence. Postpartum depression screening: negative.     Review of Systems Pertinent items are noted in HPI.   Objective:    BP 157/98 mmHg  Pulse 84  Ht 5\' 4"  (1.626 m)  Wt 179 lb 3.2 oz (81.285 kg)  BMI 30.74 kg/m2  General:  alert, cooperative and no distress   Breasts:  inspection negative, no nipple discharge or bleeding, no masses or nodularity palpable  Lungs: clear to auscultation bilaterally  Heart:  regular rate and rhythm  Abdomen: soft, non-tender; bowel sounds normal; no masses,  no organomegaly and incision: healed completely. no erythema, induration or drainage   Vulva:  normal  Vagina: normal vagina, no discharge, exudate, lesion, or erythema  Cervix:  cervical motion tenderness  Corpus: normal size, contour, position, consistency, mobility, non-tender  Adnexa:  normal adnexa and no mass, fullness, tenderness  Rectal Exam: Not performed.        Assessment:     Normal postpartum exam. Pap smear not done at today's visit.   Plan:    1. Contraception: Nexplanon  Patient given informed consent, signed copy in the chart, time out was performed. Pregnancy test was negative. Appropriate time out taken.  Patient's left arm was prepped and draped in the usual sterile fashion.. The ruler used to measure and mark insertion area.  Patient was prepped with alcohol  swab and then injected with 2.5 cc of 1% lidocaine with epinephrine.  Patient was prepped with betadine, Nexplanon removed form packaging.  Device confirmed in needle, then inserted full length of needle and withdrawn per handbook instructions.  Patient insertion site covered with a pressure dressing.   Minimal blood loss.  Patient tolerated the procedure well.  2. Patient is medically cleared to resume all activities of daily living. A referral to PCP provided for future management of CHTN currently on Norvasc 3. Follow up in:  as needed.

## 2016-05-15 IMAGING — US US MFM OB COMP +14 WKS
1 series · 14 of 28 positions shown · non-contrast
Comparison: none

[Series 1: us mfm ob comp +14 wks · 14 of 59 slices shown]
[im 3/59]
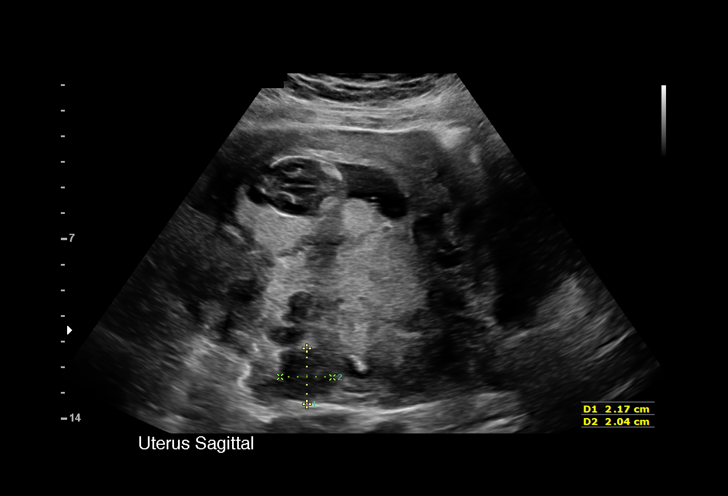
[im 7/59]
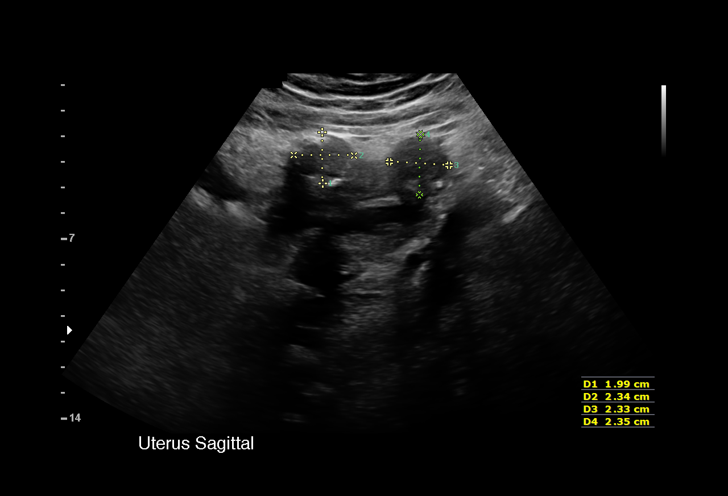
[im 11/59]
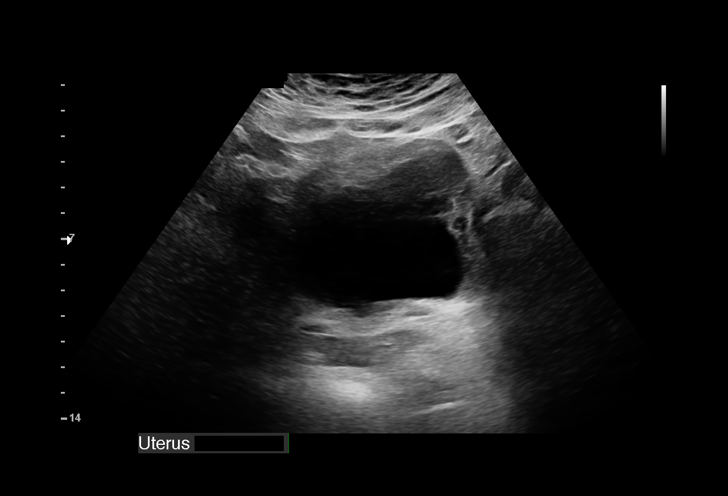
[im 16/59]
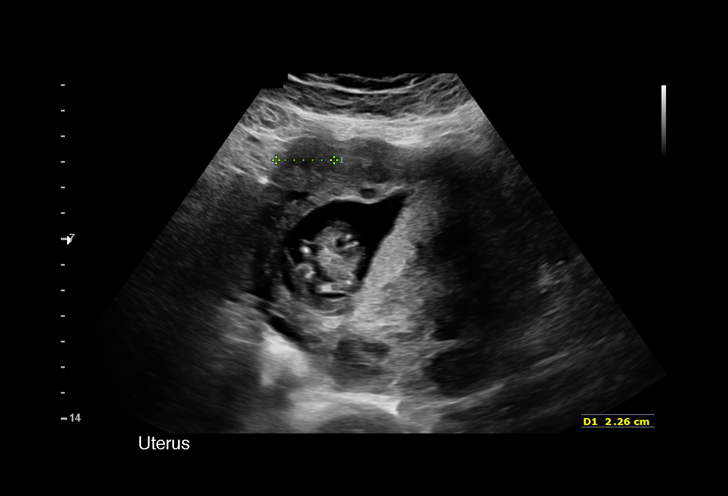
[im 20/59]
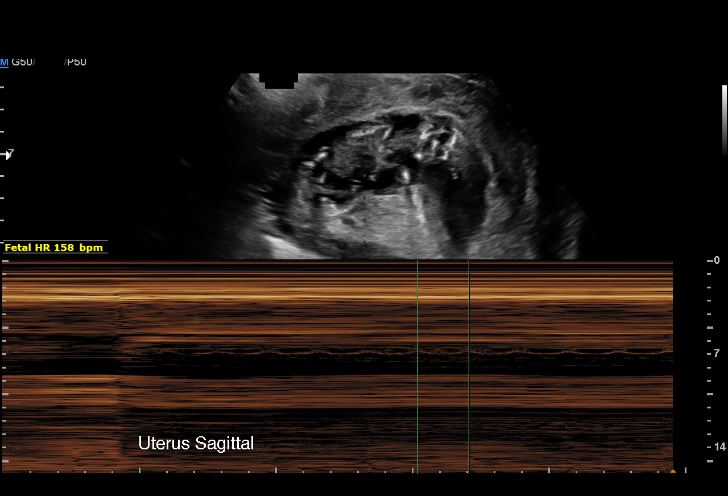
[im 24/59]
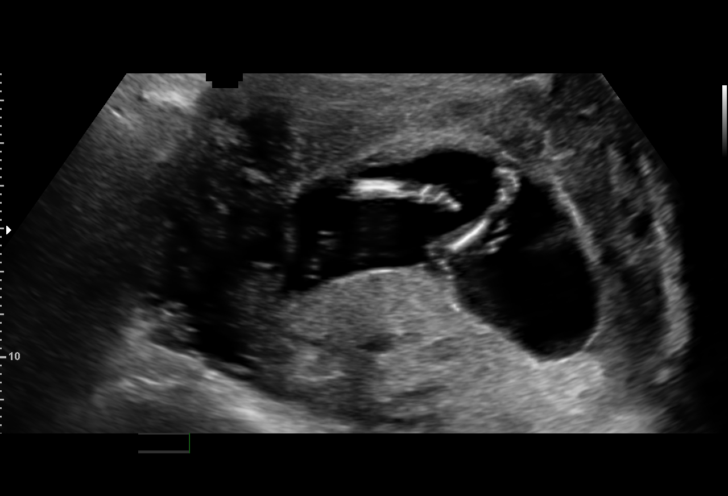
[im 28/59]
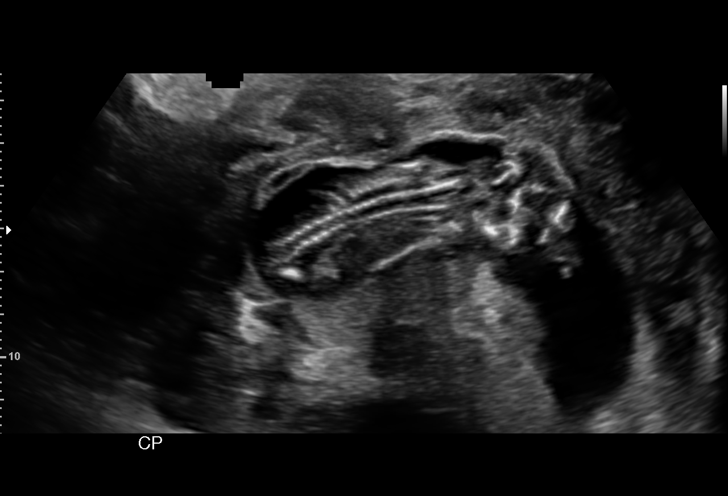
[im 33/59]
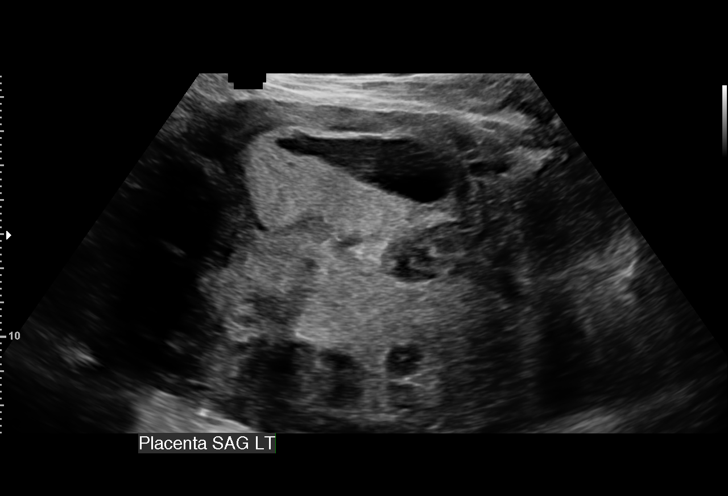
[im 37/59]
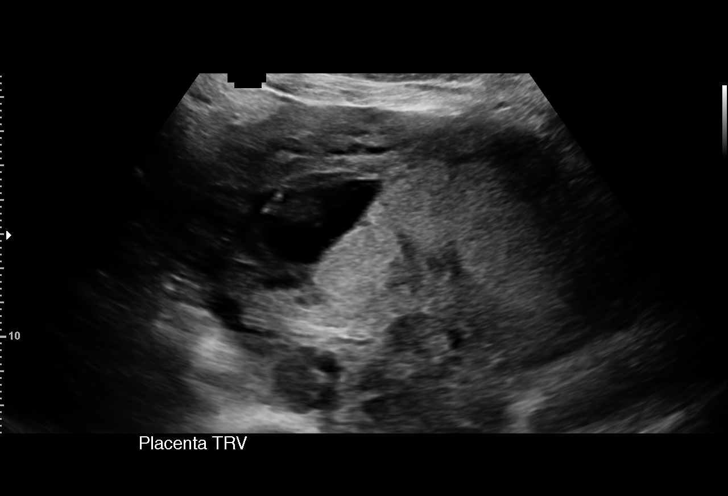
[im 41/59]
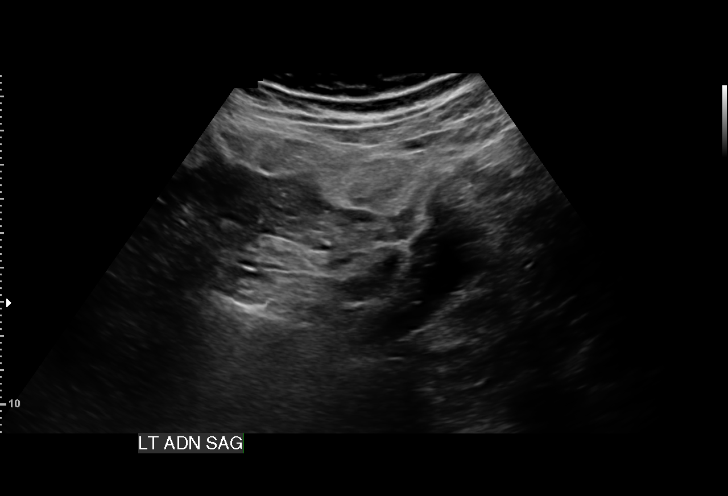
[im 46/59]
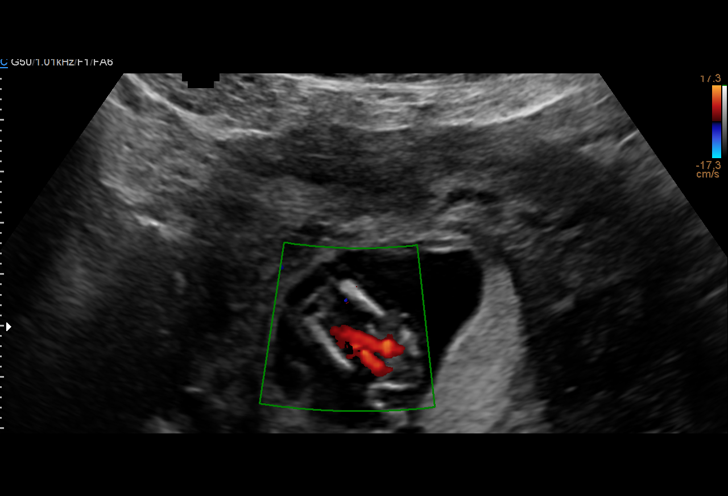
[im 50/59]
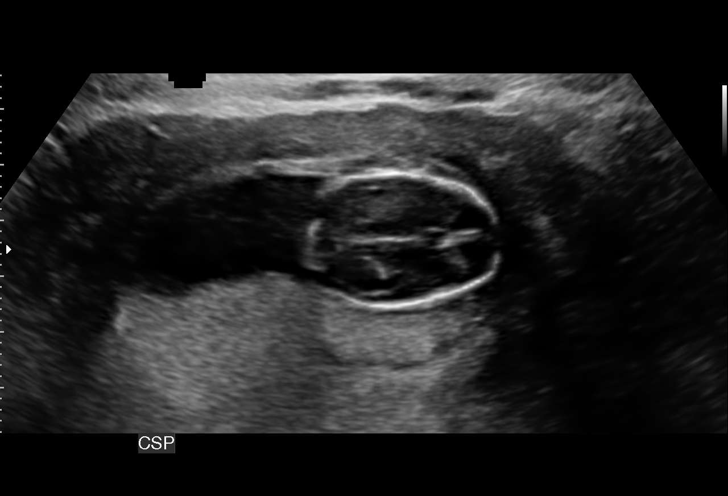
[im 54/59]
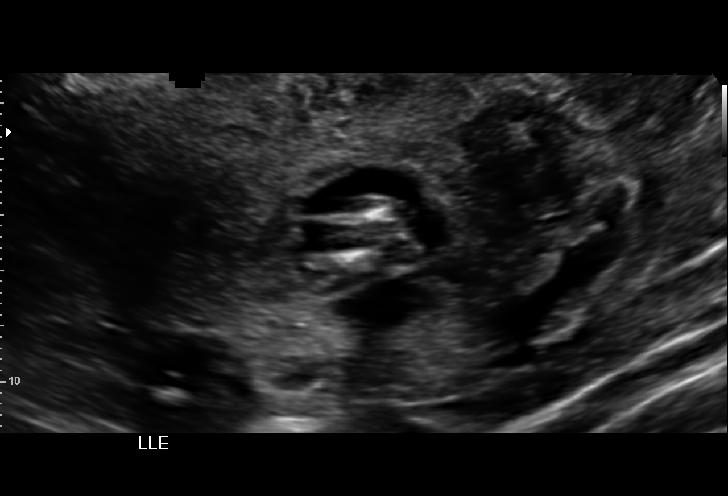
[im 59/59]
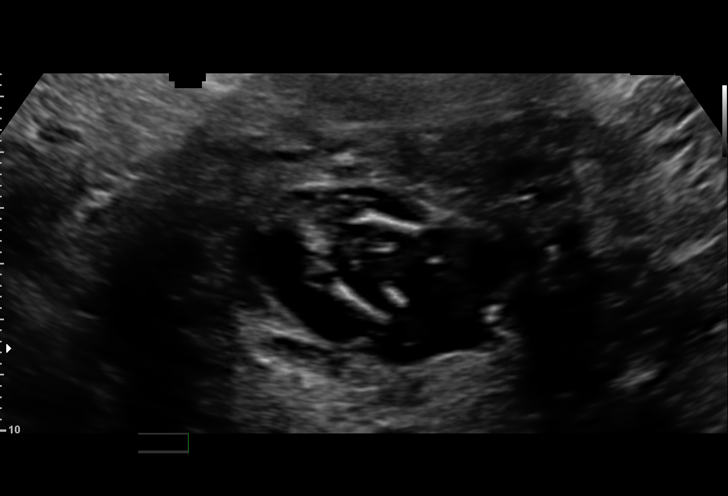

[14 of 28 positions shown; findings below may reference images not displayed]

OBSTETRICS REPORT
(Signed Final 09/11/2015 [DATE])

Name:       LUISHI GATTO                        Visit  09/11/2015 [DATE]
Date:

Service(s) Provided

Indications

Vaginal bleeding in pregnancy, second trimester
Advanced maternal age multigravida 35+, second
trimester
Uterine fibroids
14 weeks gestation of pregnancy
Previous cesarean delivery, antepartum
Fetal Evaluation

Num Of             1
Fetuses:
Fetal Heart        158                          bpm
Rate:
Cardiac Activity:  Observed
Presentation:      Variable
Placenta:          Posterior, above cervical
os

Comment     No placental abruption  identified.
:

Amniotic Fluid
AFI FV:      Subjectively within normal limits
Larg Pckt:      1.9  cm
Biometry

CRL:     86.4   m    G. Age:   14w 0d                  EDD:   03/11/16
m

BPD:     23.8   m    G. Age:   14w 0d                 CI:        64.39   70 - 86
m
FL/HC:
HC:      95.4   m    G. Age:   14w 2d                 HC/AC:      1.23   1.14 -
m
AC:      77.8   m    G. Age:   14w 1d                 FL/BPD
m                                     :
FL:      12.9   m    G. Age:   13w 5d                 FL/AC:      16.6   20 - 24
m
HUM:     13.3   m    G. Age:   13w 5d
m

Est.          89   gm    0 lb 3 oz
FW:
Gestational Age

Clinical EDD:  14w 2d                                         EDD:   03/09/16
U/S Today:     14w 0d                                         EDD:   03/11/16
Best:          14w 2d    Det. By:   Clinical EDD              EDD:   03/09/16
Anatomy

Cranium:          Appears normal         Abdominal          Appears nml (cord
Wall:              insert, abd wall)
Fetal Cavum:      Appears normal         Cord Vessels:      Appears normal (3
vessel cord)
Choroid Plexus:   Appears normal         Bladder:           Appears normal
Diaphragm:        Appears normal         Lower              Appears normal
Extremities:
Stomach:          Appears normal,        Upper              Appears normal
left sided             Extremities:
Abdomen:          Appears normal

Other:   Technically difficult due to early gestational age.
Cervix Uterus Adnexa

Uterus:       Innumerable myoma throughout uterus - largest
superior to fundus 0.6x5x 9.9x9.3cm. calcific
changes.

Left Ovary:    Not visualized.
Right Ovary:   Not visualized.
Impression

Single IUP at 14w 2d
Advanced maternal age, vaginal bleeding
Limited views of the fetal anatomy obtained due to early
gestational age
Multiple uterine myomas noted - largest superior to fundus
measuring 9.7 x 6.5 x 5.8 cm
No subchorionic fluid collection noted
Recommendations

Follow-up ultrasounds as clinically indicated.
Detailed ultrasound at 18-20 weeks

## 2016-06-20 IMAGING — US US MFM OB DETAIL+14 WK
1 series · 13 of 28 positions shown · non-contrast
Comparison: none

[Series 1: us mfm ob detail+14 wk · 13 of 137 slices shown]
[im 6/137]
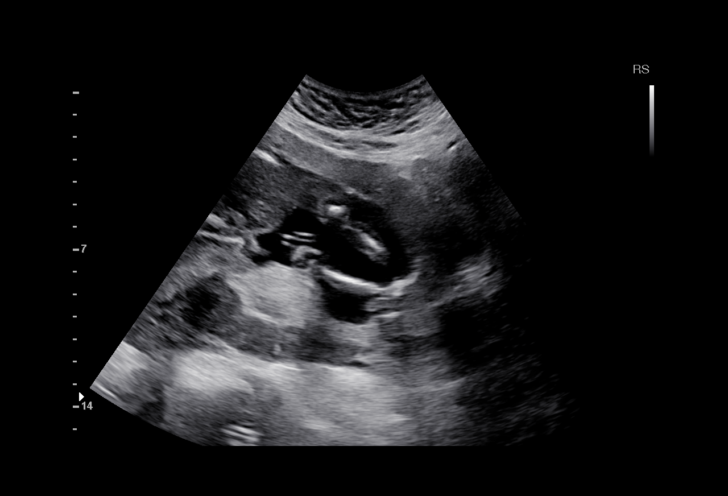
[im 16/137]
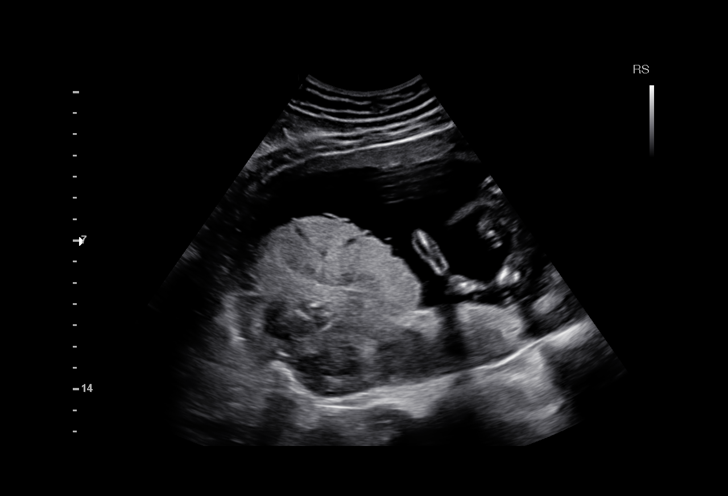
[im 26/137]
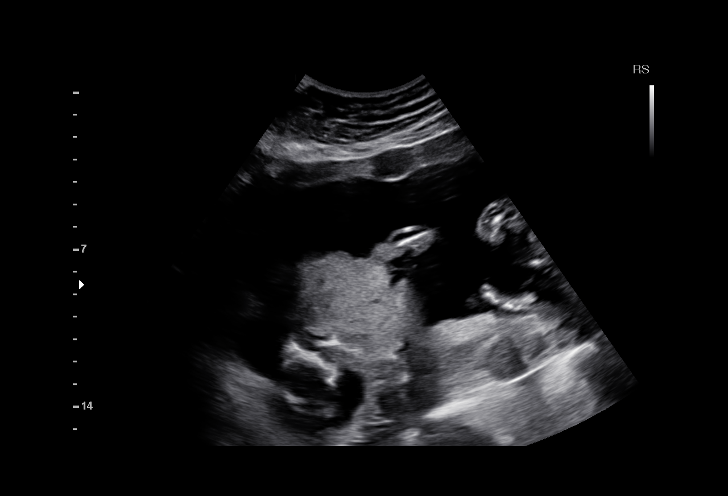
[im 36/137]
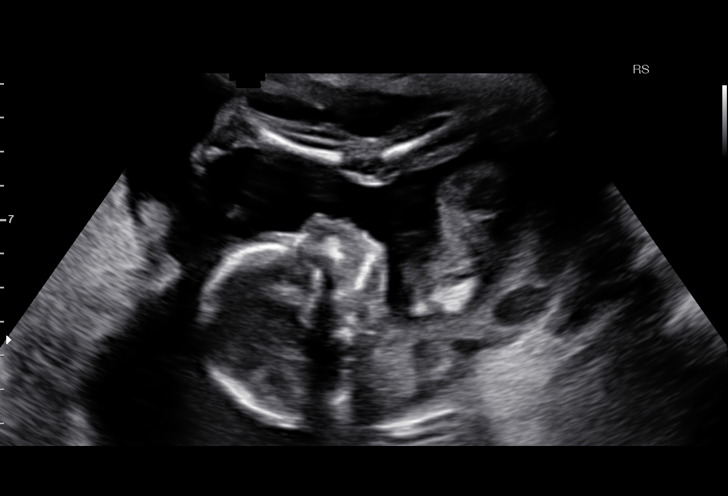
[im 46/137]
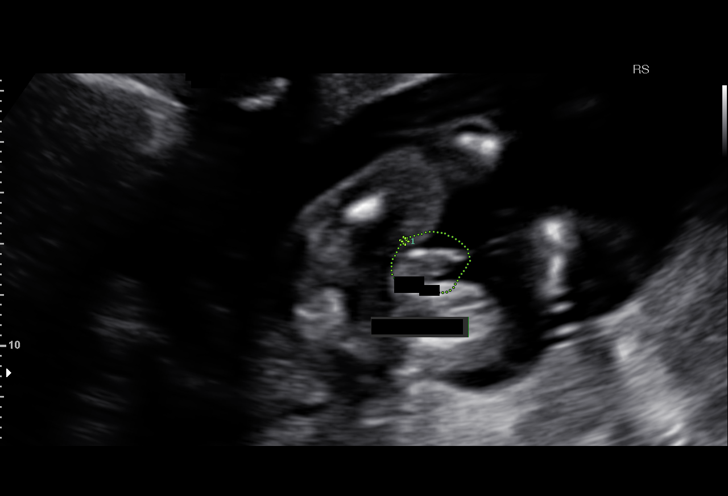
[im 56/137]
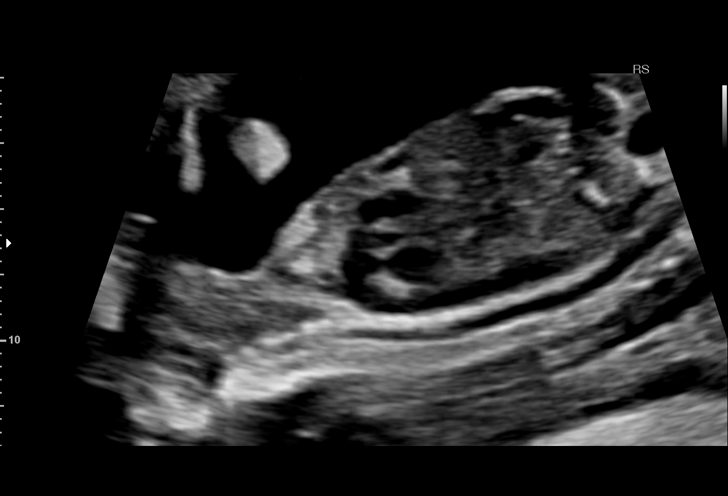
[im 71/137]
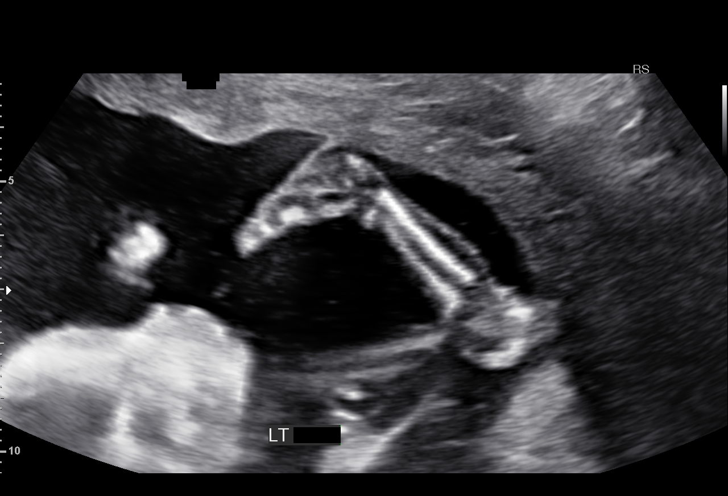
[im 81/137]
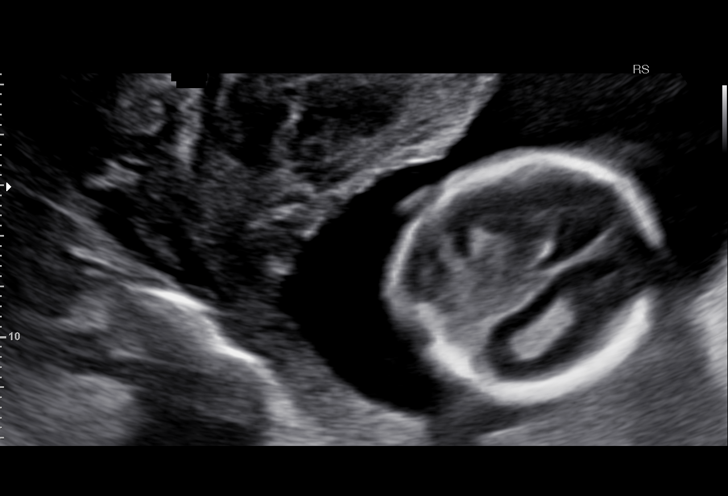
[im 91/137]
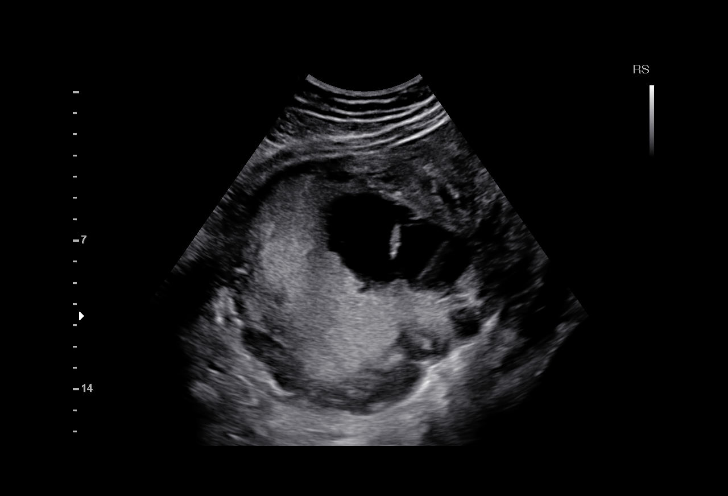
[im 101/137]
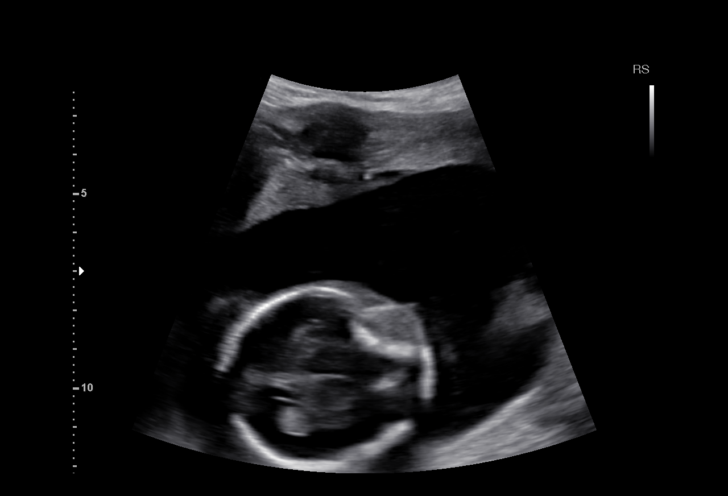
[im 111/137]
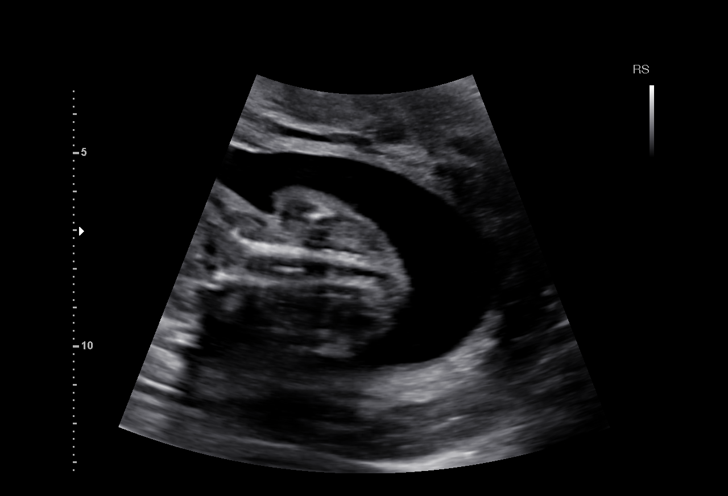
[im 121/137]
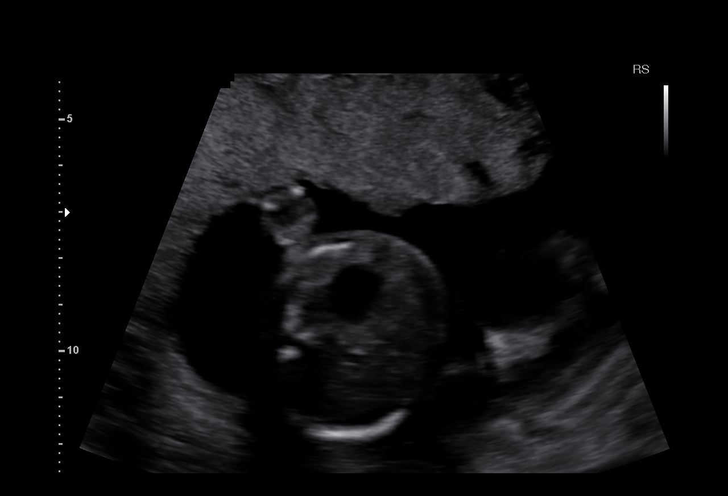
[im 131/137]
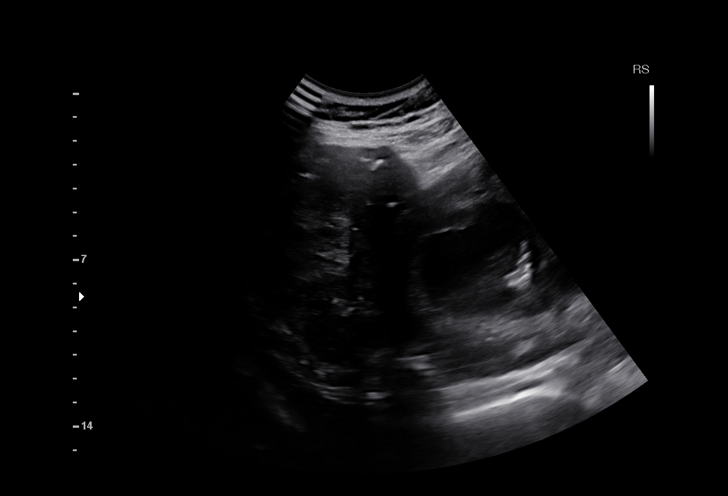

[13 of 28 positions shown; findings below may reference images not displayed]

pm)

Name:       YURII PARISI                        Visit  10/17/2015 [DATE]
Date:

1  SWIDRAK VAGLIO              [id number]       [id number]     [id number]
Indications

Vaginal bleeding in pregnancy, second
trimester
Advanced maternal age multigravida 35+,
second trimester
Uterine fibroids
Previous cesarean delivery, antepartum
19 weeks gestation of pregnancy
Detailed fetal anatomic survey                  Z36
Pre-existing essential hypertension
complicating pregnancy, second trimester
Uterine scar, other than C/S; myomectomy
OB History

Gravidity:     2         Term:  1
Living:        1
Fetal Evaluation

Num Of Fetuses:      1
Fetal Heart          153
Rate(bpm):
Cardiac Activity:    Observed
Presentation:        Breech
Placenta:            Posterior Fundal, above cervical os
P. Cord Insertion:   Visualized, central
Amniotic Fluid
AFI FV:      Subjectively within normal limits
Larg Pckt:    4.89   cm
Biometry

BPD:      44.6  mm     G. Age:   19w 3d                  CI:        72.13   %    70 - 86
FL/HC:      17.1   %    16.1 -
HC:      167.1  mm     G. Age:   19w 3d        41   %    HC/AC:      1.21        1.09 -
AC:      138.3  mm     G. Age:   19w 2d        39   %    FL/BPD      63.9   %
:
FL:       28.5  mm     G. Age:   18w 5d        21   %    FL/AC:      20.6   %    20 - 24
HUM:      27.5  mm     G. Age:   18w 6d        35   %

Est.         272   gm   0 lb 10 oz      39   %
FW:
Gestational Age

LMP:           19w 3d        Date:  06/03/15                  EDD:   03/09/16
U/S Today:     19w 2d                                         EDD:   03/10/16
Best:          19w 3d    Det. By:   LMP  (06/03/15)           EDD:   03/09/16
Anatomy

Cranium:          Appears normal         Aortic Arch:       Not well visualized
Fetal Cavum:      Appears normal         Ductal Arch:       Appears normal
Ventricles:       Appears normal         Diaphragm:         Appears normal
Choroid Plexus:   Appears normal         Stomach:           Appears normal,
left sided
Cerebellum:       Appears normal         Abdomen:           Appears normal
Posterior         Appears normal         Abdominal          Appears nml (cord
Fossa:                                   Wall:              insert, abd wall)
Nuchal Fold:      Appears normal         Cord Vessels:      Appears normal (3
vessel cord)
Face:             Appears normal         Kidneys:           Appear normal
(orbits and profile)
Lips:             Appears normal         Bladder:           Appears normal
Heart:            Appears normal         Spine:             Appears normal
(4CH, axis, and
situs)
RVOT:             Appears normal         Upper              Appears normal
Extremities:
LVOT:             Appears normal         Lower              Appears normal
Extremities:

Other:   Fetus appears to be a male. Heels visualized. Technically difficult
due to fetal position.
Cervix Uterus Adnexa

Cervix
Length:           3.64   cm.
Normal appearance by transabdominal scan.
Uterus
Multiple fibroids noted, see table below.

Left Ovary
Within normal limits.

Right Ovary
Not visualized.

Adnexa:       No abnormality visualized. No adnexal mass
visualized.
Myomas

Site                     L(cm)      W(cm)       D(cm)      Location
Right
Left/fundal              10.1       7.2         5.5        Pedunculated

Blood Flow                  RI       PI        Comments

Impression

SIUP at 19+3 weeks
Normal detailed fetal anatomy; limited views of AA
Markers of aneuploidy: none
Normal amniotic fluid volume
Measurements consistent with LMP dating
Fibroid uterus: see above for size and location (multiple,
only largest measured)

After counseling, Ms. Nia declined testing for
aneuploidy.

---------------------------------------------------------------------- Recommendations

Follow-up ultrasound for growth in 6 weeks (fibroids, cHTN)
